# Patient Record
Sex: Female | Born: 1970
Health system: Southern US, Community
[De-identification: ages and names within clinical notes are randomized; demographics above are authoritative.]

## PROBLEM LIST (undated history)

## (undated) HISTORY — PX: TUBAL LIGATION: SHX77

## (undated) HISTORY — PX: CHOLECYSTECTOMY: SHX55

## (undated) HISTORY — PX: GASTRIC BYPASS: SHX52

---

## 2016-04-21 DIAGNOSIS — Z9884 Bariatric surgery status: Secondary | ICD-10-CM | POA: Diagnosis not present

## 2016-04-21 DIAGNOSIS — K6389 Other specified diseases of intestine: Secondary | ICD-10-CM | POA: Diagnosis not present

## 2016-04-21 DIAGNOSIS — E538 Deficiency of other specified B group vitamins: Secondary | ICD-10-CM | POA: Diagnosis not present

## 2016-06-19 DIAGNOSIS — Z1231 Encounter for screening mammogram for malignant neoplasm of breast: Secondary | ICD-10-CM | POA: Diagnosis not present

## 2017-02-01 DIAGNOSIS — H524 Presbyopia: Secondary | ICD-10-CM | POA: Diagnosis not present

## 2017-02-01 DIAGNOSIS — H5212 Myopia, left eye: Secondary | ICD-10-CM | POA: Diagnosis not present

## 2017-04-13 DIAGNOSIS — M6281 Muscle weakness (generalized): Secondary | ICD-10-CM | POA: Diagnosis not present

## 2017-04-13 DIAGNOSIS — I6789 Other cerebrovascular disease: Secondary | ICD-10-CM | POA: Diagnosis not present

## 2017-04-13 DIAGNOSIS — R51 Headache: Secondary | ICD-10-CM | POA: Diagnosis not present

## 2017-04-13 DIAGNOSIS — R202 Paresthesia of skin: Secondary | ICD-10-CM | POA: Diagnosis not present

## 2017-04-13 DIAGNOSIS — R42 Dizziness and giddiness: Secondary | ICD-10-CM | POA: Diagnosis not present

## 2017-04-13 DIAGNOSIS — R531 Weakness: Secondary | ICD-10-CM | POA: Diagnosis not present

## 2017-04-13 DIAGNOSIS — G4489 Other headache syndrome: Secondary | ICD-10-CM | POA: Diagnosis not present

## 2017-04-14 DIAGNOSIS — M6281 Muscle weakness (generalized): Secondary | ICD-10-CM | POA: Diagnosis not present

## 2017-04-15 ENCOUNTER — Encounter (HOSPITAL_COMMUNITY): Payer: Self-pay

## 2017-04-15 ENCOUNTER — Emergency Department (HOSPITAL_COMMUNITY): Payer: 59

## 2017-04-15 ENCOUNTER — Emergency Department (HOSPITAL_COMMUNITY)
Admission: EM | Admit: 2017-04-15 | Discharge: 2017-04-16 | Disposition: A | Discharge status: Home / Self Care | Payer: 59 | Attending: Emergency Medicine | Admitting: Emergency Medicine

## 2017-04-15 ENCOUNTER — Ambulatory Visit (INDEPENDENT_AMBULATORY_CARE_PROVIDER_SITE_OTHER): Payer: 59 | Admitting: Physician Assistant

## 2017-04-15 VITALS — BP 118/68 | HR 64 | Temp 98.3°F | Resp 17 | Ht 63.25 in | Wt 163.4 lb

## 2017-04-15 DIAGNOSIS — F446 Conversion disorder with sensory symptom or deficit: Secondary | ICD-10-CM | POA: Insufficient documentation

## 2017-04-15 DIAGNOSIS — R531 Weakness: Secondary | ICD-10-CM

## 2017-04-15 DIAGNOSIS — R29898 Other symptoms and signs involving the musculoskeletal system: Secondary | ICD-10-CM | POA: Diagnosis not present

## 2017-04-15 DIAGNOSIS — R404 Transient alteration of awareness: Secondary | ICD-10-CM | POA: Diagnosis not present

## 2017-04-15 DIAGNOSIS — R29810 Facial weakness: Secondary | ICD-10-CM | POA: Insufficient documentation

## 2017-04-15 DIAGNOSIS — R202 Paresthesia of skin: Secondary | ICD-10-CM | POA: Diagnosis not present

## 2017-04-15 LAB — I-STAT CHEM 8, ED
BUN: 14 mg/dL (ref 6–20)
CALCIUM ION: 1.05 mmol/L — AB (ref 1.15–1.40)
CHLORIDE: 104 mmol/L (ref 101–111)
Creatinine, Ser: 0.6 mg/dL (ref 0.44–1.00)
Glucose, Bld: 94 mg/dL (ref 65–99)
HCT: 31 % — ABNORMAL LOW (ref 36.0–46.0)
Hemoglobin: 10.5 g/dL — ABNORMAL LOW (ref 12.0–15.0)
POTASSIUM: 3.8 mmol/L (ref 3.5–5.1)
SODIUM: 139 mmol/L (ref 135–145)
TCO2: 24 mmol/L (ref 0–100)

## 2017-04-15 LAB — COMPREHENSIVE METABOLIC PANEL
ALT: 15 U/L (ref 14–54)
ANION GAP: 9 (ref 5–15)
AST: 18 U/L (ref 15–41)
Albumin: 3.8 g/dL (ref 3.5–5.0)
Alkaline Phosphatase: 55 U/L (ref 38–126)
BUN: 13 mg/dL (ref 6–20)
CALCIUM: 8.5 mg/dL — AB (ref 8.9–10.3)
CHLORIDE: 106 mmol/L (ref 101–111)
CO2: 23 mmol/L (ref 22–32)
Creatinine, Ser: 0.64 mg/dL (ref 0.44–1.00)
GFR calc non Af Amer: >60 mL/min (ref >60)
Glucose, Bld: 97 mg/dL (ref 65–99)
Potassium: 3.6 mmol/L (ref 3.5–5.1)
SODIUM: 138 mmol/L (ref 135–145)
Total Bilirubin: 0.6 mg/dL (ref 0.3–1.2)
Total Protein: 6.5 g/dL (ref 6.5–8.1)

## 2017-04-15 LAB — DIFFERENTIAL
BASOS PCT: 0 %
Basophils Absolute: 0 10*3/uL (ref 0.0–0.1)
EOS ABS: 0.1 10*3/uL (ref 0.0–0.7)
EOS PCT: 1 %
Lymphocytes Relative: 46 %
Lymphs Abs: 3.3 10*3/uL (ref 0.7–4.0)
MONO ABS: 0.4 10*3/uL (ref 0.1–1.0)
Monocytes Relative: 5 %
Neutro Abs: 3.5 10*3/uL (ref 1.7–7.7)
Neutrophils Relative %: 48 %

## 2017-04-15 LAB — POCT CBC
GRANULOCYTE PERCENT: 46 % (ref 37–80)
HCT, POC: 35.2 % — AB (ref 37.7–47.9)
Hemoglobin: 11.3 g/dL — AB (ref 12.2–16.2)
Lymph, poc: 3.8 — AB (ref 0.6–3.4)
MCH, POC: 23.9 pg — AB (ref 27–31.2)
MCHC: 32.2 g/dL (ref 31.8–35.4)
MCV: 74.3 fL — AB (ref 80–97)
MID (cbc): 0.5 (ref 0–0.9)
MPV: 7 fL (ref 0–99.8)
PLATELET COUNT, POC: 450 10*3/uL — AB (ref 142–424)
POC Granulocyte: 3.7 (ref 2–6.9)
POC LYMPH %: 47.3 % (ref 10–50)
POC MID %: 6.7 %M (ref 0–12)
RBC: 4.73 M/uL (ref 4.04–5.48)
RDW, POC: 15.7 %
WBC: 8 10*3/uL (ref 4.6–10.2)

## 2017-04-15 LAB — CBC
HCT: 31.6 % — ABNORMAL LOW (ref 36.0–46.0)
Hemoglobin: 10 g/dL — ABNORMAL LOW (ref 12.0–15.0)
MCH: 24 pg — AB (ref 26.0–34.0)
MCHC: 31.6 g/dL (ref 30.0–36.0)
MCV: 75.8 fL — AB (ref 78.0–100.0)
PLATELETS: 367 10*3/uL (ref 150–400)
RBC: 4.17 MIL/uL (ref 3.87–5.11)
RDW: 15.2 % (ref 11.5–15.5)
WBC: 7.3 10*3/uL (ref 4.0–10.5)

## 2017-04-15 LAB — I-STAT TROPONIN, ED: Troponin i, poc: 0 ng/mL (ref 0.00–0.08)

## 2017-04-15 LAB — PROTIME-INR
INR: 1.03
PROTHROMBIN TIME: 13.6 s (ref 11.4–15.2)

## 2017-04-15 LAB — APTT: aPTT: 27 seconds (ref 24–36)

## 2017-04-15 LAB — POCT GLYCOSYLATED HEMOGLOBIN (HGB A1C): HEMOGLOBIN A1C: 5.9

## 2017-04-15 MED ORDER — GADOBENATE DIMEGLUMINE 529 MG/ML IV SOLN
15.0000 mL | Freq: Once | INTRAVENOUS | Status: AC
  Administered 2017-04-15: 15 mL via INTRAVENOUS

## 2017-04-15 NOTE — ED Notes (Signed)
Pt has returned from MRI. 

## 2017-04-15 NOTE — ED Notes (Signed)
Pt returned from MRI °

## 2017-04-15 NOTE — Patient Instructions (Signed)
     IF you received an x-ray today, you will receive an invoice from [facility]. Please contact [facility] at [phone] with questions or concerns regarding your invoice.   IF you received labwork today, you will receive an invoice from [facility]. Please contact [facility] at [phone] with questions or concerns regarding your invoice.   Our billing staff will not be able to assist you with questions regarding bills from these companies.  You will be contacted with the lab results as soon as they are available. The fastest way to get your results is to activate your My Chart account. Instructions are located on the last page of this paperwork. If you have not heard from us regarding the results in 2 weeks, please contact this office.    We recommend that you schedule a mammogram for breast cancer screening. Typically, you do not need a referral to do this. Please contact a local imaging center to schedule your mammogram.  [facility] - [phone]  *ask for the Radiology Department [facility]) - [phone] or [phone]  [facility] - [phone] [facility] - [phone] [facility] - [phone]  *ask for the Radiology Department [facility] - [phone]  *ask for the Radiology Department [facility] - [phone]  *ask for the Mammography Department [facility] - [phone] 

## 2017-04-15 NOTE — ED Triage Notes (Signed)
PER EMS: pt from [facility]: Wednesday she was at work, had sudden onset of weakness. She was transferred to [facility] and had a negative workup. Today, while pt was at work she began to have weakness and tingling to left arm that started at 9am. CBG-100, O2-99% RA, BP- 110/59, HR-61 sinus rhythm, RR-16. Denies headache, pupils equal and reactive. No slurred speech, no droop. Pt is ambulatory independently. Pt states she is still experiencing the weakness and tingling to left arm.

## 2017-04-15 NOTE — Progress Notes (Signed)
04/15/2017 5:46 PM   DOB: 1971/06/18 / MRN: [id number]  SUBJECTIVE:  Carly Rogers is a 46 y.o. female presenting for ED follow up. Tells me that she was having stroke like symptoms and was taken to the ED at [facility] where a CT head was performed and she was told that she had "a blockage in the left side of her brain."  She continues have weakness and tingling in her left side today. She was advised to take an aspirin daily and to make an appointment with the neurologist that saw her.   She is now taking ASA and a multivitamin. She denies a history of HTN, diabetes, CAD, and she is a never smoker.  Her father did die of a heart attack at age 54.    She has no allergies on file.   She  has no past medical history on file.    She   She  has no sexual activity history on file. The patient  has no past surgical history on file.  Her family history is not on file.  Review of Systems  Constitutional: Negative for chills and fever.  Eyes: Negative for blurred vision and double vision.  Respiratory: Negative for shortness of breath.   Cardiovascular: Negative for chest pain.  Gastrointestinal: Negative for nausea.  Skin: Negative for rash.  Neurological: Positive for sensory change and headaches. Negative for tingling, tremors, speech change, focal weakness, seizures and loss of consciousness.    The problem list and medications were reviewed and updated by myself where necessary and exist elsewhere in the encounter.   OBJECTIVE:  BP 118/68 (BP Location: Right Arm, Patient Position: Sitting, Cuff Size: Normal)   Pulse 64   Temp 98.3 F (36.8 C) (Oral)   Resp 17   Ht 5' 3.25" (1.607 m)   Wt 163 lb 6.4 oz (74.1 kg)   LMP 02/11/2016 Comment: per patient thinks menopause is setting in.  SpO2 99%   BMI 28.72 kg/m   Physical Exam  Constitutional: She appears well-developed and well-nourished. She is active.  Non-toxic appearance. No distress.  She appears worried.  Cardiovascular:  Normal rate.   Pulmonary/Chest: Effort normal. No tachypnea.  Neurological: She is alert. Coordination and gait normal.  Positive pronator drift of left upper extremity.  Grip strength 4/5 on left compared to right.   Skin: Skin is warm and dry. She is not diaphoretic. No pallor.    No results found for this or any previous visit (from the past 72 hour(s)).  No results found.  CT code stroke on 74/18:  1. No acute intracranial abnormality. However, if there is clinical concern for acute ischemia, MRI could further evaluate as CT is relatively insensitive for the detection of an acute infarct in the first 24 to 48 hours.  2. Luminal irregularity along the right cervical ICA has an appearance favoring a connective tissue disorder such as fibromuscular dysplasia (FMD) over dissection given that the carotid has a normal appearance at the skull base. No dissection flap or intraluminal thrombus is identified. MRA with axial fat-saturated T1 precontrast sequence could further evaluate for the presence of a dissection if clinically indicated. There is questionable minimal luminal irregularity along the proximal left ICA; however, this process appears to predominantly involve the right internal carotid artery. 3. No aneurysm, significant intracranial stenosis, or major branch vessel occlusion. 4. No evidence of infarction or significant penumbra on the cerebral perfusion analysis.  ASSESSMENT AND PLAN:  [name] was seen  today for heat exposure and headaches.  Diagnoses and all orders for this visit:  Weakness of right arm: Patient seen at [facility] and stroke was ruled out at that time given a normal CT with some mild abnormalties:  (Luminal irregularity along the right cervical ICA has an appearance favoring a connective tissue disorder such as fibromuscular dysplasia (FMD) over dissection given that the carotid has a normal appearance at the skull base. No dissection flap or intraluminal thrombus is  identified. MRA with axial fat-saturated T1 precontrast sequence could further evaluate for the presence of a dissection if clinically indicated. There is questionable minimal luminal irregularity along the proximal left ICA; however, this process appears to predominantly involve the right internal carotid artery.)   Per care everywhere she has had a change in her neurological exam as she had no pronator drift and no grip strength changes.  She was also seen in the setting of dehydration/migraine and this could be clouding the a true diagnosis.    Per her CT scan at [facility] an MRA was recommended if her symptoms did not abate.  I am sending her to [facility] ED as I think she is either having or has had a CVA since being seen in the ED at [facility] and she would benefit from an MRA at this point and possible repeat neurological eval.  Appreciate the care of our EDP.    -     Lipid panel -     POCT CBC -     CMP14+EGFR -     POCT glycosylated hemoglobin (Hb A1C)    The patient is advised to call or return to clinic if she does not see an improvement in symptoms, or to seek the care of the closest emergency department if she worsens with the above plan.   Philis Fendt, MHS, PA-C [facility] 04/15/2017 5:46 PM

## 2017-04-15 NOTE — ED Notes (Signed)
Pt still off floor at MRI 

## 2017-04-15 NOTE — ED Notes (Signed)
Pt has been transported to MRI at this time.

## 2017-04-15 NOTE — ED Provider Notes (Signed)
[facility] DEPT Provider Note   CSN: [id number] Arrival date & time: 04/15/17  1836     History   Chief Complaint Chief Complaint  Patient presents with  . Weakness    HPI Carly Rogers is a 46 y.o. female.  HPI  46 year old female presents today with complaints of left-sided weakness.  Patient notes that on July 4 she was [place].  She felt hot and developed left-sided numbness tingling and weakness.  She was taken to [facility] where she had an evaluation including CT scan which showed no acute findings.  She was seen by neurology who recommended outpatient follow-up.  Patient notes that her symptoms are improving while she was at the hospital, was able to walk out of the emergency room.  She notes yesterday she took work off and still had very minor symptoms but had significantly improved.  Patient notes today she developed recurrence of symptoms with worsening left-sided numbness tingling and weakness.  She notes this is located on left side of her face, arm and lower extremity.  She notes this is diffuse.  She reports symptoms started after receiving a frustrating text from her employer.  She reports some fullness and pressure in the left side of her head, she denies any difficulty swallowing, trauma, infectious etiologies.   History reviewed. No pertinent past medical history.  There are no active problems to display for this patient.   Past Surgical History:  Procedure Laterality Date  . CHOLECYSTECTOMY    . GASTRIC BYPASS    . TUBAL LIGATION      OB History    No data available       Home Medications    Prior to Admission medications   Not on File    Family History No family history on file.  Social History Social History  Substance Use Topics  . Smoking status: Never Smoker  . Smokeless tobacco: Never Used  . Alcohol use Yes     Allergies   Patient has no allergy information on  record.   Review of Systems Review of Systems  All other systems reviewed and are negative.    Physical Exam Updated Vital Signs BP 128/63 (BP Location: Right Arm)   Pulse 61   Temp 98.8 F (37.1 C) (Oral)   Resp 18   LMP 02/10/2017   SpO2 100%   Physical Exam  Constitutional: She is oriented to person, place, and time. She appears well-developed and well-nourished.  HENT:  Head: Normocephalic and atraumatic.  Eyes: Conjunctivae are normal. Pupils are equal, round, and reactive to light. Right eye exhibits no discharge. Left eye exhibits no discharge. No scleral icterus.  Neck: Normal range of motion. No JVD present. No tracheal deviation present.  Cardiovascular: Normal rate, regular rhythm, normal heart sounds and intact distal pulses.  Exam reveals no gallop and no friction rub.   No murmur heard. Pulmonary/Chest: Effort normal. No stridor.  Neurological: She is alert and oriented to person, place, and time. A sensory deficit is present. No cranial nerve deficit. Coordination normal.  Decreased sensation to the left side of the face, left upper extremity, left lower extremity.  No facial asymmetry or weakness, minor weakness to the left upper and left lower extremity strength is still 5 out of 5.   Psychiatric: She has a normal mood and affect. Her behavior is normal. Judgment and thought content normal.  Nursing note and vitals reviewed.  ED Treatments /  Results  Labs (all labs ordered are listed, but only abnormal results are displayed) Labs Reviewed  I-STAT CHEM 8, ED - Abnormal; Notable for the following:       Result Value   Calcium, Ion 1.05 (*)    Hemoglobin 10.5 (*)    HCT 31.0 (*)    All other components within normal limits  PROTIME-INR  APTT  CBC  DIFFERENTIAL  COMPREHENSIVE METABOLIC PANEL  I-STAT TROPOININ, ED    EKG  EKG Interpretation None       Radiology No results found.  Procedures Procedures (including critical care  time)  Medications Ordered in ED Medications - No data to display   Initial Impression / Assessment and Plan / ED Course  I have reviewed the triage vital signs and the nursing notes.  Pertinent labs & imaging results that were available during my care of the patient were reviewed by me and considered in my medical decision making (see chart for details).    Final Clinical Impressions(s) / ED Diagnoses   Final diagnoses:  Left-sided weakness    Imaging: MRI without  Consults:  Therapeutics:  Discharge Meds:   Assessment/Plan: 47 year old female presents today with left-sided weakness.  Patient has had intermittent symptoms over the last several days.  She was worked up at [facility] with [place] scan.  She was seen by neurology who recommended no further evaluation and management, recommended outpatient evaluation.  Patient having recurrence of symptoms, have low suspicion for acute intracranial abnormality at this time.  Patient will have an MRI here with likely disposition home with no significant findings noted.  Patient care will be transferred to oncoming provider pending MRI and disposition.   New Prescriptions New Prescriptions   No medications on file     Francee Gentile 04/15/17 2008    Charlesetta Shanks, MD 04/16/17 [id number]

## 2017-04-15 NOTE — ED Notes (Signed)
Pt returned from MRI and connected back to cardiac monitor, pulse ox and BP cuff.

## 2017-04-15 NOTE — ED Notes (Signed)
Pt has been taken back to MRI

## 2017-04-16 LAB — CMP14+EGFR
ALBUMIN: 4.7 g/dL (ref 3.5–5.5)
ALT: 17 IU/L (ref 0–32)
AST: 18 IU/L (ref 0–40)
Albumin/Globulin Ratio: 1.6 (ref 1.2–2.2)
Alkaline Phosphatase: 67 IU/L (ref 39–117)
BILIRUBIN TOTAL: 0.5 mg/dL (ref 0.0–1.2)
BUN / CREAT RATIO: 23 (ref 9–23)
BUN: 15 mg/dL (ref 6–24)
CHLORIDE: 102 mmol/L (ref 96–106)
CO2: 22 mmol/L (ref 20–29)
CREATININE: 0.64 mg/dL (ref 0.57–1.00)
Calcium: 9.1 mg/dL (ref 8.7–10.2)
GFR calc non Af Amer: 107 mL/min/{1.73_m2} (ref >59)
GFR, EST AFRICAN AMERICAN: 123 mL/min/{1.73_m2} (ref >59)
GLOBULIN, TOTAL: 2.9 g/dL (ref 1.5–4.5)
GLUCOSE: 92 mg/dL (ref 65–99)
Potassium: 4.2 mmol/L (ref 3.5–5.2)
SODIUM: 141 mmol/L (ref 134–144)
TOTAL PROTEIN: 7.6 g/dL (ref 6.0–8.5)

## 2017-04-16 LAB — LIPID PANEL
CHOLESTEROL TOTAL: 224 mg/dL — AB (ref 100–199)
Chol/HDL Ratio: 2.7 ratio (ref 0.0–4.4)
HDL: 82 mg/dL (ref >39)
LDL Calculated: 119 mg/dL — ABNORMAL HIGH (ref 0–99)
Triglycerides: 115 mg/dL (ref 0–149)
VLDL Cholesterol Cal: 23 mg/dL (ref 5–40)

## 2017-04-16 NOTE — ED Provider Notes (Signed)
MRI has some abnormalities that will need to reevaluated in 6 months with repeat MRI Patient will be referred to Neurology for furhter evaluation and continued monitoring    Junius Creamer, NP 04/16/17 5436    Charlesetta Shanks, MD 04/16/17 1347

## 2017-04-16 NOTE — ED Notes (Signed)
Pt ambulated to restroom and back.

## 2017-04-16 NOTE — Discharge Instructions (Signed)
As discussed there is a subtile abnormality on your MRI that will need to be repeated in 6 months In the mean time make an appointment with neurology for further evaluation of your weakness Keep a written diary of your symptoms to include when it happen how long it lasts, what your are doing at the time and what you do to alleviate the symptoms

## 2017-04-19 ENCOUNTER — Other Ambulatory Visit: Payer: 59

## 2017-04-19 ENCOUNTER — Encounter: Payer: Self-pay | Admitting: Neurology

## 2017-04-19 ENCOUNTER — Ambulatory Visit (INDEPENDENT_AMBULATORY_CARE_PROVIDER_SITE_OTHER): Payer: 59 | Admitting: Neurology

## 2017-04-19 VITALS — BP 124/80 | HR 73 | Ht 63.25 in | Wt 163.0 lb

## 2017-04-19 DIAGNOSIS — R200 Anesthesia of skin: Secondary | ICD-10-CM | POA: Diagnosis not present

## 2017-04-19 DIAGNOSIS — R9082 White matter disease, unspecified: Secondary | ICD-10-CM

## 2017-04-19 DIAGNOSIS — G43109 Migraine with aura, not intractable, without status migrainosus: Secondary | ICD-10-CM

## 2017-04-19 DIAGNOSIS — R930 Abnormal findings on diagnostic imaging of skull and head, not elsewhere classified: Secondary | ICD-10-CM

## 2017-04-19 MED ORDER — GABAPENTIN 100 MG PO CAPS: 100.0000 mg | ORAL_CAPSULE | Freq: Two times a day (BID) | ORAL | 3 refills | Status: DC

## 2017-04-19 NOTE — Patient Instructions (Signed)
1.  To help with the left sided "heaviness" feeling, we will start gabapentin 100mg  twice daily.  You may increase dose to 2 capsules twice daily in 2 weeks if needed. 2.  We will check MRI of cervical spine with and without contrast. Further recommendations depending on results.  If unremarkable, I would repeat MRI of brain with and without contrast in 6 months. 3. Check Bhavana, Sed rate, SSA/SSB antibodies (Sjogren's) and ANCA  4. Follow up in 6 months

## 2017-04-19 NOTE — Progress Notes (Signed)
NEUROLOGY CONSULTATION NOTE  Carly Rogers MRN: [id number] DOB: 1970/12/07  Referring provider: Charlesetta Shanks, MD Primary care provider: no PCP   Reason for consult:  Left sided numbness and weakness  HISTORY OF PRESENT ILLNESS: Carly Rogers is a 46 year old right-handed female who presents for left sided weakness.  History supplemented by ED note.  On 04/13/17, she was working outside at the baseball [place] in the heat when she suddenly felt dizzy, like she was going to pass out.  She was brought inside to a cool room for heat stroke.  While in the room, she developed numbness and tingling of the left side of her body (face, arm and leg) and weakness.  She had associated left sided moderate dull nonthrobbing headache.  There was no associated nausea or visual disturbance.  She was taken to [facility], where CT of head was performed and revealed no acute findings.  CTA of head revealed no intracranial stenosis or occlusion.  CTA of neck revealed luminal irregularity in the right ICA at the C1-2 level, suspicious for possible fibromuscular dysplasia.  She was discharged with instructions to follow up with outpatient neurology.  Symptoms improved while in the hospital.  On 04/15/17, she became stressed about needing a letter to return to work when she developed a recurrence of the left sided numbness along with headache.  She presented the [facility] ED for further evaluation.  MRI of brain with and without contrast was personally reviewed and revealed scattered punctate non-enhancing T2 hyperintense foci in the periventricular white matter.  Symptoms lasted all day.  While headache and most of the numbness have resolved, she still feels a heaviness involving the left side of her head, neck and arm, along with a dull numbness.  She reports mild left sided neck pain.  She denies personal history of headache.  She denies family history of headache.  She does report recent  stressors.  04/15/17 LABS:  CBC with WBC 7.3, HGB 10, HCT 31.6, MCV 75.8, PLT 367; CMP with Na 138, K 3.6, Cl 106, CO2 23, glucose 97, BUN 13, Cr 0.64, total bili 0.6, ALP 55, AST 18 and ALT 15.  PAST MEDICAL HISTORY: No past medical history on file.  PAST SURGICAL HISTORY: Past Surgical History:  Procedure Laterality Date  . CHOLECYSTECTOMY    . GASTRIC BYPASS    . TUBAL LIGATION      MEDICATIONS: Current Outpatient Prescriptions on File Prior to Visit  Medication Sig Dispense Refill  . aspirin EC 81 MG tablet Take 81 mg by mouth daily.    Marland Kitchen esomeprazole (NEXIUM) 20 MG capsule Take 20 mg by mouth daily as needed (heart burn).    . Multiple Vitamin (MULTIVITAMIN WITH MINERALS) TABS tablet Take 1 tablet by mouth daily.     No current facility-administered medications on file prior to visit.     ALLERGIES: No Known Allergies  FAMILY HISTORY: Family History  Problem Relation Age of Onset  . Hypertension Mother   . Heart attack Father   . Stroke Maternal Grandmother     SOCIAL HISTORY: Social History   Social History  . Marital status: Married    Spouse name: N/A  . Number of children: 4  . Years of education: N/A   Occupational History  . [employer]   Social History Main Topics  . Smoking status: Never Smoker  . Smokeless tobacco: Never Used  . Alcohol use Yes  Comment: occasinally...1 glass of wine a month  . Drug use: Unknown  . Sexual activity: Not on file   Other Topics Concern  . Not on file   Social History Narrative   Lives in [place] with husband and child. Has 4 children and one grandson.     REVIEW OF SYSTEMS: Constitutional: No fevers, chills, or sweats, no generalized fatigue, change in appetite Eyes: No visual changes, double vision, eye pain Ear, nose and throat: No hearing loss, ear pain, nasal congestion, sore throat Cardiovascular: No chest pain, palpitations Respiratory:  No shortness of breath at  rest or with exertion, wheezes GastrointestinaI: No nausea, vomiting, diarrhea, abdominal pain, fecal incontinence Genitourinary:  No dysuria, urinary retention or frequency Musculoskeletal:  Mild left sided neck pain Integumentary: No rash, pruritus, skin lesions Neurological: as above Psychiatric: No depression, insomnia, anxiety Endocrine: No palpitations, fatigue, diaphoresis, mood swings, change in appetite, change in weight, increased thirst Hematologic/Lymphatic:  No purpura, petechiae. Allergic/Immunologic: no itchy/runny eyes, nasal congestion, recent allergic reactions, rashes  PHYSICAL EXAM: Vitals:   04/19/17 0948  BP: 124/80  Pulse: 73   General: No acute distress.  Patient appears well-groomed.  Head:  Normocephalic/atraumatic Eyes:  fundi examined but not visualized Neck: supple, mild left suboccipital and left sided tenderness, full range of motion Back: No paraspinal tenderness Heart: regular rate and rhythm Lungs: Clear to auscultation bilaterally. Vascular: No carotid bruits. Neurological Exam: Mental status: alert and oriented to person, place, and time, recent and remote memory intact, fund of knowledge intact, attention and concentration intact, speech fluent and not dysarthric, language intact. Cranial nerves: CN I: not tested CN II: pupils equal, round and reactive to light, visual fields intact CN III, IV, VI:  full range of motion, no nystagmus, no ptosis CN V: facial sensation slightly reduced on left V1-V3 CN VII: upper and lower face symmetric CN VIII: hearing intact CN IX, X: gag intact, uvula midline CN XI: sternocleidomastoid and trapezius muscles intact CN XII: tongue midline Bulk & Tone: normal, no fasciculations. Motor:  Mild giveaway weakness in left triceps and 5-/5 left hand grip.  Otherwise, 5/5 throughout  Sensation:  Pinprick sensation reduced in left upper extremity and vibration sensation intact. Deep Tendon Reflexes:  2+ throughout,  toes downgoing. Finger to nose testing:  Without dysmetria.  Heel to shin:  Without dysmetria.  Gait:  Normal station and stride.  Able to turn and tandem walk. Romberg negative.  IMPRESSION: 1.  Probable complicated migraine with improved but still persistent aura.  MRI revealed no acute abnormality.  CTA of head and neck show no significant stenosis or occlusion to account for symptoms. 2.  Abnormal white matter abnormalities on MRI of brain.  Nonspecific findings and not specifically characteristic of a demyelinating disease.  However, she does not have stroke risk factors and denies history of migraine.  PLAN: 1.  We will check MRI of cervical spine with and without contrast to evaluate for any demyelinating spinal cord lesion.  If unremarkable, I would repeat MRI of brain with and without contrast in 6 months. 2.  Will check Laterra, Sed Rate, SSA/SSB antibodies and ANCA 3.  To address the "heavy" sensation of her head and arm, will start gabapentin 100mg  twice daily and we can titrate up from there. 4.  Follow up  Thank you for allowing me to take part in the care of this patient.  Metta Clines, DO

## 2017-04-20 LAB — SEDIMENTATION RATE: SED RATE: 27 mm/h — AB (ref 0–20)

## 2017-04-20 LAB — ANA: ANA: NEGATIVE

## 2017-04-20 LAB — SJOGRENS SYNDROME-A EXTRACTABLE NUCLEAR ANTIBODY: SSA (RO) (ENA) ANTIBODY, IGG: NEGATIVE

## 2017-04-21 DIAGNOSIS — K6389 Other specified diseases of intestine: Secondary | ICD-10-CM | POA: Diagnosis not present

## 2017-04-21 DIAGNOSIS — E663 Overweight: Secondary | ICD-10-CM | POA: Diagnosis not present

## 2017-04-21 DIAGNOSIS — Z6829 Body mass index (BMI) 29.0-29.9, adult: Secondary | ICD-10-CM | POA: Diagnosis not present

## 2017-04-21 DIAGNOSIS — Z9884 Bariatric surgery status: Secondary | ICD-10-CM | POA: Diagnosis not present

## 2017-04-21 LAB — ANCA SCREEN W REFLEX TITER: ANCA SCREEN: NEGATIVE

## 2017-04-22 ENCOUNTER — Telehealth: Payer: Self-pay | Admitting: *Deleted

## 2017-04-22 ENCOUNTER — Encounter: Payer: Self-pay | Admitting: *Deleted

## 2017-04-22 NOTE — Telephone Encounter (Signed)
Patient notified of normal results via My Chart.

## 2017-04-22 NOTE — Telephone Encounter (Signed)
See next note

## 2017-04-22 NOTE — Telephone Encounter (Signed)
-----   Message from Pieter Partridge, DO sent at 04/22/2017  7:19 AM EDT -----   ----- Message ----- From: Pieter Partridge, DO Sent: 04/21/2017   5:53 PM To: Aileen Pilot Alondra Sahni, LPN  Labs all look okay

## 2017-04-29 ENCOUNTER — Ambulatory Visit
Admission: RE | Admit: 2017-04-29 | Discharge: 2017-04-29 | Disposition: A | Discharge status: Home / Self Care | Payer: 59 | Source: Ambulatory Visit | Attending: Neurology | Admitting: Neurology

## 2017-04-29 DIAGNOSIS — R200 Anesthesia of skin: Secondary | ICD-10-CM

## 2017-04-29 DIAGNOSIS — M47812 Spondylosis without myelopathy or radiculopathy, cervical region: Secondary | ICD-10-CM | POA: Diagnosis not present

## 2017-04-29 DIAGNOSIS — R9082 White matter disease, unspecified: Secondary | ICD-10-CM

## 2017-04-29 MED ORDER — GADOBENATE DIMEGLUMINE 529 MG/ML IV SOLN: 15.0000 mL | Freq: Once | INTRAVENOUS | Status: DC | PRN

## 2017-05-09 ENCOUNTER — Telehealth: Payer: Self-pay | Admitting: Neurology

## 2017-05-09 DIAGNOSIS — R200 Anesthesia of skin: Secondary | ICD-10-CM

## 2017-05-09 NOTE — Telephone Encounter (Signed)
Patient made aware. Reminder sent to [facility] to order MR Brain in 6 months.

## 2017-05-09 NOTE — Telephone Encounter (Signed)
-----   Message from Pieter Partridge, DO sent at 05/09/2017 10:40 AM EDT ----- MRI of cervical spine looks okay.  Repeat MRI of brain with and without contrast in 6 months (prior to follow up).

## 2017-10-12 ENCOUNTER — Telehealth: Payer: Self-pay

## 2017-10-12 NOTE — Addendum Note (Signed)
Addended by: Clois Comber on: 10/12/2017 02:43 PM   Modules accepted: Orders

## 2017-10-12 NOTE — Telephone Encounter (Signed)
Called scheduling at [facility], spoke with Melvenia Beam, they will contact Pt to schedule MRI brain w w/o

## 2017-10-21 ENCOUNTER — Ambulatory Visit: Payer: 59 | Admitting: Neurology

## 2017-11-17 ENCOUNTER — Ambulatory Visit
Admission: RE | Admit: 2017-11-17 | Discharge: 2017-11-17 | Disposition: A | Discharge status: Home / Self Care | Payer: 59 | Source: Ambulatory Visit | Attending: Neurology | Admitting: Neurology

## 2017-11-17 DIAGNOSIS — R200 Anesthesia of skin: Secondary | ICD-10-CM

## 2017-11-17 DIAGNOSIS — H538 Other visual disturbances: Secondary | ICD-10-CM | POA: Diagnosis not present

## 2017-11-17 MED ORDER — GADOBENATE DIMEGLUMINE 529 MG/ML IV SOLN
15.0000 mL | Freq: Once | INTRAVENOUS | Status: AC | PRN
  Administered 2017-11-17: 15 mL via INTRAVENOUS

## 2017-11-21 ENCOUNTER — Telehealth: Payer: Self-pay

## 2017-11-21 NOTE — Telephone Encounter (Signed)
-----   Message from Pieter Partridge, DO sent at 11/18/2017  6:44 AM EST ----- MRI of brain is unchanged.  It shows the small spots but it has not progressed.  At this point, I don't think there is any clinical significance (sometimes we see people with tiny spots on the brain for unknown reason).

## 2017-11-21 NOTE — Telephone Encounter (Signed)
Called and LM on VM for Pt to call me back about MRI

## 2017-11-22 ENCOUNTER — Telehealth: Payer: Self-pay | Admitting: Neurology

## 2017-11-22 NOTE — Telephone Encounter (Signed)
Called and spoke with Pt, advsd of MRI results. Pt questioned if the spots could be causing memory problems. Advsd her at this point, they pose no clinical significance.

## 2017-11-22 NOTE — Telephone Encounter (Signed)
Pt left a message saying she was returning a call from [place]

## 2018-02-22 ENCOUNTER — Encounter: Payer: Self-pay | Admitting: Neurology

## 2018-02-22 ENCOUNTER — Ambulatory Visit (INDEPENDENT_AMBULATORY_CARE_PROVIDER_SITE_OTHER): Payer: 59 | Admitting: Neurology

## 2018-02-22 VITALS — BP 116/70 | HR 71 | Ht 63.0 in | Wt 165.0 lb

## 2018-02-22 DIAGNOSIS — R9082 White matter disease, unspecified: Secondary | ICD-10-CM

## 2018-02-22 DIAGNOSIS — G43109 Migraine with aura, not intractable, without status migrainosus: Secondary | ICD-10-CM | POA: Diagnosis not present

## 2018-02-22 NOTE — Progress Notes (Signed)
NEUROLOGY FOLLOW UP OFFICE NOTE  JOURDEN Rogers [id number]  HISTORY OF PRESENT ILLNESS: Carly Rogers is a 47 year old right-handed female who follows up for complicated migraine with persistent aura.  UPDATE: Labs from 04/19/17 include negative Amela, Sed Rate 27, negative Sjogrens antibodies and ANCA. To further evaluate left sided numbness, MRI of cervical spine with and without contrast was performed on 04/29/17 which was personally reviewed and revealed mild cervical spondylosis at C5-6 but no abnormal cord signal or evidence of central or foraminal stenosis.  Follow up MRI of brain with and without contrast from 11/17/17 was personally reviewed and was unchanged.   To address the "heavy" sensation of her head and arm, she was started on gabapentin 100mg  twice daily.  Symptoms had resolved and she has since discontinued it.  Since last summer, she reports a couple of brief episodes of left sided numbness and tingling lasting about half a day.  No associated weakness or headache.  They would occur in setting of increased stress.  HISTORY:  On 04/13/17, she [place] the [place] in [place] when she suddenly felt dizzy, like she was going to pass out.  She was brought inside to a cool room for heat stroke.  While in the room, she developed numbness and tingling of the left side of her body (face, arm and leg) and weakness.  She had associated left sided moderate dull nonthrobbing headache.  There was no associated nausea or visual disturbance.  She was taken to [facility], where CT of head was performed and revealed no acute findings.  CTA of head revealed no intracranial stenosis or occlusion.  CTA of neck revealed luminal irregularity in the right ICA at the C1-2 level, suspicious for possible fibromuscular dysplasia.  She was discharged with instructions to follow up with outpatient neurology.  Symptoms improved while in the hospital.  On 04/15/17, she became stressed  about needing a letter to return to work when she developed a recurrence of the left sided numbness along with headache.  She presented the [facility] ED for further evaluation.  MRI of brain with and without contrast scattered punctate non-enhancing T2 hyperintense foci in the periventricular white matter.  Symptoms lasted all day.  While headache and most of the numbness have resolved, she still feels a heaviness involving the left side of her head, neck and arm, along with a dull numbness.  She reports mild left sided neck pain.   She denies personal history of headache.  She denies family history of headache.  She does report recent stressors.  PAST MEDICAL HISTORY: History reviewed. No pertinent past medical history.  MEDICATIONS: Current Outpatient Medications on File Prior to Visit  Medication Sig Dispense Refill  . esomeprazole (NEXIUM) 20 MG capsule Take 20 mg by mouth daily as needed (heart burn).    . gabapentin (NEURONTIN) 100 MG capsule Take 1 capsule (100 mg total) by mouth 2 (two) times daily. 60 capsule 3  . Multiple Vitamin (MULTIVITAMIN WITH MINERALS) TABS tablet Take 1 tablet by mouth daily.     No current facility-administered medications on file prior to visit.     ALLERGIES: No Known Allergies  FAMILY HISTORY: Family History  Problem Relation Age of Onset  . Hypertension Mother   . Heart attack Father   . Stroke Maternal Grandmother     SOCIAL HISTORY: Social History   Socioeconomic History  . Marital status: Married    Spouse name:  Not on file  . Number of children: 4  . Years of education: Not on file  . Highest education level: Not on file  Occupational History  . Occupation: Psychologist, clinical: [employer]  . Financial resource strain: Not on file  . Food insecurity:    Worry: Not on file    Inability: Not on file  . Transportation needs:    Medical: Not on file    Non-medical: Not on file  Tobacco Use  .  Smoking status: Never Smoker  . Smokeless tobacco: Never Used  Substance and Sexual Activity  . Alcohol use: Yes    Comment: occasinally...1 glass of wine a month  . Drug use: Not on file  . Sexual activity: Not on file  Lifestyle  . Physical activity:    Days per week: Not on file    Minutes per session: Not on file  . Stress: Not on file  Relationships  . Social connections:    Talks on phone: Not on file    Gets together: Not on file    Attends religious service: Not on file    Active member of club or organization: Not on file    Attends meetings of clubs or organizations: Not on file    Relationship status: Not on file  . Intimate partner violence:    Fear of current or ex partner: Not on file    Emotionally abused: Not on file    Physically abused: Not on file    Forced sexual activity: Not on file  Other Topics Concern  . Not on file  Social History Narrative   Lives in [place] with husband and child. Has 4 children and one grandson.     REVIEW OF SYSTEMS: Constitutional: No fevers, chills, or sweats, no generalized fatigue, change in appetite Eyes: No visual changes, double vision, eye pain Ear, nose and throat: No hearing loss, ear pain, nasal congestion, sore throat Cardiovascular: No chest pain, palpitations Respiratory:  No shortness of breath at rest or with exertion, wheezes GastrointestinaI: No nausea, vomiting, diarrhea, abdominal pain, fecal incontinence Genitourinary:  No dysuria, urinary retention or frequency Musculoskeletal:  No neck pain, back pain Integumentary: No rash, pruritus, skin lesions Neurological: as above Psychiatric: No depression, insomnia, anxiety Endocrine: No palpitations, fatigue, diaphoresis, mood swings, change in appetite, change in weight, increased thirst Hematologic/Lymphatic:  No purpura, petechiae. Allergic/Immunologic: no itchy/runny eyes, nasal congestion, recent allergic reactions, rashes  PHYSICAL EXAM: Vitals:     02/22/18 1045  BP: 116/70  Pulse: 71  SpO2: 99%   General: No acute distress.  Patient appears well-groomed.  normal body habitus. Head:  Normocephalic/atraumatic Eyes:  Fundi examined but not visualized Neck: supple, no paraspinal tenderness, full range of motion Heart:  Regular rate and rhythm Lungs:  Clear to auscultation bilaterally Back: No paraspinal tenderness Neurological Exam: alert and oriented to person, place, and time. Attention span and concentration intact, recent and remote memory intact, fund of knowledge intact.  Speech fluent and not dysarthric, language intact.  CN II-XII intact. Bulk and tone normal, muscle strength 5/5 throughout.  Sensation to light touch  intact.  Deep tendon reflexes 2+ throughout.  Finger to nose testing intact.  Gait normal, Romberg negative.  IMPRESSION: 1.  Probable complicated migraine.  Recurrent episodes may be migraine or stress-related.  I do not suspect TIA. 2.  Abnormal white matter abnormalities on MRI of brain.  Nonspecific findings and not specifically  characteristic of a demyelinating disease.  Stable on repeat imaging.  PLAN: 1.  As I do not suspect a cerebrovascular event and she does not have any stroke risk factors, she may discontinue aspirin 2.  We will repeat MRI of brain with and without contrast in one year to follow up on any changes. 3.  Follow up in one year after repeat MRI.  15 minutes spent face to face with patient, over 50% spent discussing differential diagnosis and management.  Metta Clines, DO

## 2018-02-22 NOTE — Patient Instructions (Addendum)
I still think the numbness was either a migraine or stress related.  I DO NOT think you had a mini stroke.  Therefore you may stop the aspirin.  We will repeat MRI of brain with and without contrast in one year to follow up on any changes and follow up with me afterward

## 2018-02-28 DIAGNOSIS — Z124 Encounter for screening for malignant neoplasm of cervix: Secondary | ICD-10-CM | POA: Diagnosis not present

## 2018-02-28 DIAGNOSIS — Z01419 Encounter for gynecological examination (general) (routine) without abnormal findings: Secondary | ICD-10-CM | POA: Diagnosis not present

## 2018-02-28 DIAGNOSIS — R5383 Other fatigue: Secondary | ICD-10-CM | POA: Diagnosis not present

## 2018-02-28 DIAGNOSIS — Z1231 Encounter for screening mammogram for malignant neoplasm of breast: Secondary | ICD-10-CM | POA: Diagnosis not present

## 2018-03-08 DIAGNOSIS — M25511 Pain in right shoulder: Secondary | ICD-10-CM | POA: Diagnosis not present

## 2018-03-08 DIAGNOSIS — M7581 Other shoulder lesions, right shoulder: Secondary | ICD-10-CM | POA: Diagnosis not present

## 2018-03-08 DIAGNOSIS — M259 Joint disorder, unspecified: Secondary | ICD-10-CM | POA: Diagnosis not present

## 2018-03-08 DIAGNOSIS — M7541 Impingement syndrome of right shoulder: Secondary | ICD-10-CM | POA: Diagnosis not present

## 2018-03-08 DIAGNOSIS — M25512 Pain in left shoulder: Secondary | ICD-10-CM | POA: Diagnosis not present

## 2018-03-08 DIAGNOSIS — M7542 Impingement syndrome of left shoulder: Secondary | ICD-10-CM | POA: Diagnosis not present

## 2018-04-27 DIAGNOSIS — E669 Obesity, unspecified: Secondary | ICD-10-CM | POA: Diagnosis not present

## 2018-04-27 DIAGNOSIS — K6389 Other specified diseases of intestine: Secondary | ICD-10-CM | POA: Diagnosis not present

## 2018-04-27 DIAGNOSIS — D508 Other iron deficiency anemias: Secondary | ICD-10-CM | POA: Diagnosis not present

## 2018-05-01 MED FILL — CELECOXIB 200 MG CAP: 200 | 30 days supply | Qty: 60 | Fill #0

## 2018-08-02 MED FILL — CELECOXIB 200 MG CAP: 200 | 30 days supply | Qty: 60 | Fill #1

## 2018-10-25 ENCOUNTER — Encounter: Payer: Self-pay | Admitting: Neurology

## 2018-11-07 DIAGNOSIS — M222X1 Patellofemoral disorders, right knee: Secondary | ICD-10-CM | POA: Diagnosis not present

## 2018-11-10 DIAGNOSIS — M25561 Pain in right knee: Secondary | ICD-10-CM | POA: Diagnosis not present

## 2018-11-10 DIAGNOSIS — M2391 Unspecified internal derangement of right knee: Secondary | ICD-10-CM | POA: Diagnosis not present

## 2018-11-13 ENCOUNTER — Encounter: Payer: Self-pay | Admitting: Neurology

## 2018-11-15 ENCOUNTER — Other Ambulatory Visit: Payer: Self-pay | Admitting: Orthopedic Surgery

## 2018-11-15 DIAGNOSIS — M2391 Unspecified internal derangement of right knee: Secondary | ICD-10-CM

## 2018-11-15 DIAGNOSIS — M25561 Pain in right knee: Secondary | ICD-10-CM

## 2018-11-18 ENCOUNTER — Ambulatory Visit
Admission: RE | Admit: 2018-11-18 | Discharge: 2018-11-18 | Disposition: A | Discharge status: Home / Self Care | Payer: 59 | Source: Ambulatory Visit | Attending: Orthopedic Surgery | Admitting: Orthopedic Surgery

## 2018-11-18 DIAGNOSIS — M25561 Pain in right knee: Secondary | ICD-10-CM

## 2018-11-18 DIAGNOSIS — M2391 Unspecified internal derangement of right knee: Secondary | ICD-10-CM

## 2018-11-18 DIAGNOSIS — M75121 Complete rotator cuff tear or rupture of right shoulder, not specified as traumatic: Secondary | ICD-10-CM | POA: Diagnosis not present

## 2018-11-28 DIAGNOSIS — M958 Other specified acquired deformities of musculoskeletal system: Secondary | ICD-10-CM | POA: Diagnosis not present

## 2018-11-28 DIAGNOSIS — S83241A Other tear of medial meniscus, current injury, right knee, initial encounter: Secondary | ICD-10-CM | POA: Diagnosis not present

## 2018-11-30 DIAGNOSIS — S83241A Other tear of medial meniscus, current injury, right knee, initial encounter: Secondary | ICD-10-CM | POA: Diagnosis not present

## 2018-11-30 DIAGNOSIS — M958 Other specified acquired deformities of musculoskeletal system: Secondary | ICD-10-CM | POA: Diagnosis not present

## 2018-11-30 DIAGNOSIS — M21951 Unspecified acquired deformity of right thigh: Secondary | ICD-10-CM | POA: Diagnosis not present

## 2018-11-30 DIAGNOSIS — Z6830 Body mass index (BMI) 30.0-30.9, adult: Secondary | ICD-10-CM | POA: Diagnosis not present

## 2018-11-30 DIAGNOSIS — E785 Hyperlipidemia, unspecified: Secondary | ICD-10-CM | POA: Diagnosis not present

## 2018-11-30 DIAGNOSIS — Z79899 Other long term (current) drug therapy: Secondary | ICD-10-CM | POA: Diagnosis not present

## 2018-11-30 DIAGNOSIS — M2241 Chondromalacia patellae, right knee: Secondary | ICD-10-CM | POA: Diagnosis not present

## 2018-11-30 DIAGNOSIS — E669 Obesity, unspecified: Secondary | ICD-10-CM | POA: Diagnosis not present

## 2018-12-21 DIAGNOSIS — M2550 Pain in unspecified joint: Secondary | ICD-10-CM | POA: Diagnosis not present

## 2018-12-21 DIAGNOSIS — M159 Polyosteoarthritis, unspecified: Secondary | ICD-10-CM | POA: Diagnosis not present

## 2018-12-21 DIAGNOSIS — M1711 Unilateral primary osteoarthritis, right knee: Secondary | ICD-10-CM | POA: Diagnosis not present

## 2018-12-21 DIAGNOSIS — G8929 Other chronic pain: Secondary | ICD-10-CM | POA: Diagnosis not present

## 2018-12-21 DIAGNOSIS — M79641 Pain in right hand: Secondary | ICD-10-CM | POA: Diagnosis not present

## 2018-12-21 DIAGNOSIS — M79642 Pain in left hand: Secondary | ICD-10-CM | POA: Diagnosis not present

## 2019-01-31 DIAGNOSIS — Z4789 Encounter for other orthopedic aftercare: Secondary | ICD-10-CM | POA: Diagnosis not present

## 2019-02-06 ENCOUNTER — Other Ambulatory Visit: Payer: Self-pay

## 2019-02-06 ENCOUNTER — Encounter: Payer: Self-pay | Admitting: Physical Therapy

## 2019-02-06 ENCOUNTER — Ambulatory Visit: Payer: 59 | Attending: Orthopedic Surgery | Admitting: Physical Therapy

## 2019-02-06 DIAGNOSIS — R262 Difficulty in walking, not elsewhere classified: Secondary | ICD-10-CM | POA: Diagnosis not present

## 2019-02-06 DIAGNOSIS — M25661 Stiffness of right knee, not elsewhere classified: Secondary | ICD-10-CM | POA: Diagnosis not present

## 2019-02-06 DIAGNOSIS — M25561 Pain in right knee: Secondary | ICD-10-CM

## 2019-02-06 NOTE — Patient Instructions (Signed)
Access Code: [email or web address]  URL: [email or web address]  Date: 02/06/2019  Prepared by: Lum Babe   Exercises  Supine Short Arc Quad - 10 reps - 2 sets - 3 hold - 2x daily - 7x weekly  Short Arc Quad with [employer] - 10 reps - 2 sets - 3 hold - 2x daily - 7x weekly  Supine Bridge - 10 reps - 2 sets - 3 hold - 2x daily - 7x weekly  Standing Alternating Knee Flexion - 10 reps - 2 sets - 3 hold - 2x daily - 7x weekly

## 2019-02-06 NOTE — Therapy (Signed)
[place], [place], [address] Phone: [phone]   Fax:  [phone]  Physical Therapy Evaluation  Patient Details  Name: Carly Rogers MRN: [id number] Date of Birth: [birth year] Referring Provider (PT): Andee Poles   Encounter Date: 02/06/2019  PT End of Session - 02/06/19 1359    Visit Number  1    Date for PT Re-Evaluation  04/08/19    PT Start Time  0102    PT Stop Time  1413    PT Time Calculation (min)  40 min    Activity Tolerance  Patient tolerated treatment well    Behavior During Therapy  [facility] for tasks assessed/performed       History reviewed. No pertinent past medical history.  Past Surgical History:  Procedure Laterality Date  . CHOLECYSTECTOMY    . GASTRIC BYPASS    . TUBAL LIGATION      There were no vitals filed for this visit.   Subjective Assessment - 02/06/19 1337    Subjective  Patient reports that on 11/30/18 she underwent a right knee scope, she report sthat they did a meniscus repair.  Patient reports that she was 6 weeks non weightbearing.  She was then put in a don joy brace.  I was able to find the operative report and it looks like a chondroplasty was done as well.      Limitations  Walking;Lifting;House hold activities;Standing    How long can you walk comfortably?  difficulty > 4 minutes    Patient Stated Goals  have no pain, walk normal and have normal ROM and strength    Currently in Pain?  Yes    Pain Score  1     Pain Location  Knee    Pain Orientation  Right;Anterior;Medial    Pain Descriptors / Indicators  Aching    Pain Type  Acute pain;Surgical pain    Pain Onset  More than a month ago    Pain Frequency  Constant    Aggravating Factors   walking, weight bear on the right leg pain up to 8/10    Pain Relieving Factors  rest, pain meds pain can be a 1/10    Effect of Pain on Daily Activities  difficulty with ADL's, walking         [facility] PT Assessment -  02/06/19 0001      Assessment   Medical Diagnosis  S/P right knee scope with chondroplasty    Referring Provider (PT)  Azar    Onset Date/Surgical Date  11/30/18    Prior Therapy  no      Precautions   Precautions  None      Balance Screen   Has the patient fallen in the past 6 months  No    Has the patient had a decrease in activity level because of a fear of falling?   No    Is the patient reluctant to leave their home because of a fear of falling?   No      Home Environment   Additional Comments  has stairs, does the housework      Prior Function   Level of Independence  Independent    Vocation  Full time employment    Vocation Requirements  mostly sitting    Leisure  no exercise      ROM / Strength   AROM / PROM / Strength  AROM;PROM;Strength      AROM  AROM Assessment Site  Knee    Right/Left Knee  Right    Right Knee Extension  13    Right Knee Flexion  105      PROM   PROM Assessment Site  Knee    Right/Left Knee  Right    Right Knee Extension  0    Right Knee Flexion  113      Strength   Overall Strength Comments  4-/5 with mild pain      Flexibility   Soft Tissue Assessment /Muscle Length  yes    Hamstrings  tight    Quadriceps  tight    ITB  tight    Piriformis  tight      Palpation   Palpation comment  has lateral tracking patella, decreased VMO      Ambulation/Gait   Gait Comments  no device but with large DonJoy brace on, antalgic on the right, some trendelenberg type gait on the right, c/o pain with stance phase                Objective measurements completed on examination: See above findings.      [place] Adult PT Treatment/Exercise - 02/06/19 0001      Exercises   Exercises  Knee/Hip      Knee/Hip Exercises: Aerobic   Recumbent Bike  x 4 minutes    Nustep  level 4 x 5 minutes               PT Short Term Goals - 02/06/19 1416      PT SHORT TERM GOAL #1   Title  independent with initial HEP    Time  2     Period  Weeks    Status  New        PT Long Term Goals - 02/06/19 1416      PT LONG TERM GOAL #1   Title  able to go up and down stairs step over step    Time  8    Period  Weeks    Status  New      PT LONG TERM GOAL #2   Title  decrease pain 50% with activity    Time  8    Period  Weeks    Status  New      PT LONG TERM GOAL #3   Title  walk without deviation    Time  8    Period  Weeks    Status  New      PT LONG TERM GOAL #4   Title  increase AROM to WNL's    Time  8    Period  Weeks    Status  New             Plan - 02/06/19 1410    Clinical Impression Statement  Patient underwent a right knee scope with meniscal debridement and a chondroplasty on 11/30/18.  She reports 6 weeks of non weight bearing and using crutches, reports that after that she got a large brace for her knee and then used a walker for the past two weeks.  She has a slight limitaiton in ROM.  She has pain with ROM/stregnth test and any weight bearing.  She is not using any device except for the brace for walking, has a limp and trendelenberg pattern on the right..  The patella is tracking laterally, with weak VMO due to a lack of TKE    Personal Factors and Comorbidities  Fitness    Examination-Activity Limitations  Stand;Stairs;Lift;Locomotion Level;Squat    Examination-Participation Restrictions  Cleaning;Yard Work    Public affairs consultant  Low    Rehab Potential  Good    PT Frequency  2x / week    PT Duration  6 weeks    PT Treatment/Interventions  ADLs/Self Care Home Management;Electrical Stimulation;Cryotherapy;Gait training;Stair training;Functional mobility training;Therapeutic activities;Neuromuscular re-education;Balance training;Therapeutic exercise;Patient/family education;Manual techniques;Vasopneumatic Device    PT Next Visit Plan  slowly add exericses    Consulted and Agree with Plan of Care  Patient        Patient will benefit from skilled therapeutic intervention in order to improve the following deficits and impairments:  Abnormal gait, Decreased balance, Decreased endurance, Decreased mobility, Difficulty walking, Cardiopulmonary status limiting activity, Decreased range of motion, Decreased activity tolerance, Decreased strength, Impaired flexibility, Pain  Visit Diagnosis: Acute pain of right knee - Plan: PT plan of care cert/re-cert  Stiffness of right knee, not elsewhere classified - Plan: PT plan of care cert/re-cert  Difficulty in walking, not elsewhere classified - Plan: PT plan of care cert/re-cert     Problem List There are no active problems to display for this patient.   Sumner Boast., PT 02/06/2019, 2:20 PM  [place], [place], [address] Phone: [phone]   Fax:  [phone]  Name: THRESIA RAMANATHAN MRN: [id number] Date of Birth: [birth year]

## 2019-02-13 ENCOUNTER — Ambulatory Visit: Payer: 59 | Attending: Orthopedic Surgery | Admitting: Physical Therapy

## 2019-02-13 ENCOUNTER — Other Ambulatory Visit: Payer: Self-pay

## 2019-02-13 ENCOUNTER — Encounter: Payer: Self-pay | Admitting: Physical Therapy

## 2019-02-13 DIAGNOSIS — M25561 Pain in right knee: Secondary | ICD-10-CM | POA: Diagnosis not present

## 2019-02-13 DIAGNOSIS — M25661 Stiffness of right knee, not elsewhere classified: Secondary | ICD-10-CM | POA: Insufficient documentation

## 2019-02-13 DIAGNOSIS — R262 Difficulty in walking, not elsewhere classified: Secondary | ICD-10-CM | POA: Diagnosis not present

## 2019-02-13 NOTE — Therapy (Signed)
[place], [place], [address] Phone: [phone]   Fax:  [phone]  Physical Therapy Treatment  Patient Details  Name: Carly Rogers MRN: [id number] Date of Birth: [birth year] Referring Provider (PT): Andee Poles   Encounter Date: 02/13/2019  PT End of Session - 02/13/19 1428    Visit Number  2    Date for PT Re-Evaluation  04/08/19    PT Start Time  3267    PT Stop Time  1425    PT Time Calculation (min)  40 min    Activity Tolerance  Patient tolerated treatment well    Behavior During Therapy  [facility] for tasks assessed/performed       History reviewed. No pertinent past medical history.  Past Surgical History:  Procedure Laterality Date  . CHOLECYSTECTOMY    . GASTRIC BYPASS    . TUBAL LIGATION      There were no vitals filed for this visit.  Subjective Assessment - 02/13/19 1348    Subjective  "Better, not feeling as much pain"    Currently in Pain?  No/denies    Pain Score  0-No pain                       [facility] Adult PT Treatment/Exercise - 02/13/19 0001      Knee/Hip Exercises: Aerobic   Recumbent Bike  x 4 minutes    Nustep  level 3 x 6 minutes      Knee/Hip Exercises: Machines for Strengthening   Total Gym Leg Press  20lb 2x10       Knee/Hip Exercises: Standing   Heel Raises  Both;2 sets;15 reps;2 seconds    Forward Step Up  Right;2 sets;5 reps;Hand Hold: 0;Step Height: 4"      Knee/Hip Exercises: Seated   Long Arc Quad  Right;10 reps;3 sets    [employer] Weight  3 lbs.    Other Seated Knee/Hip Exercises  Quad sets with manual resistance 2x10 RLE    Hamstring Curl  Right;2 sets;15 reps    Hamstring Limitations  green tband                PT Short Term Goals - 02/13/19 1429      PT SHORT TERM GOAL #1   Title  independent with initial HEP    Status  Achieved        PT Long Term Goals - 02/13/19 1429      PT LONG TERM GOAL #1   Title   able to go up and down stairs step over step    Status  On-going      PT LONG TERM GOAL #2   Title  decrease pain 50% with activity    Status  Partially Met            Plan - 02/13/19 1430    Clinical Impression Statement  Pt ambulated in clinic without brace. Pt tolerated an initial progression to TE well. RLK did fatigue with seated HS curls. R knee pain reported during the eccentric phase of step ups and quad sets.     Examination-Activity Limitations  Stand;Stairs;Lift;Locomotion Level;Squat    Examination-Participation Restrictions  Cleaning;Yard Work    Stability/Clinical Decision Making  Evolving/Moderate complexity    Rehab Potential  Good    PT Frequency  2x / week    PT Duration  6 weeks    PT Treatment/Interventions  ADLs/Self  Care Home Management;Electrical Stimulation;Cryotherapy;Gait training;Stair training;Functional mobility training;Therapeutic activities;Neuromuscular re-education;Balance training;Therapeutic exercise;Patient/family education;Manual techniques;Vasopneumatic Device    PT Next Visit Plan  slowly add exercises       Patient will benefit from skilled therapeutic intervention in order to improve the following deficits and impairments:  Abnormal gait, Decreased balance, Decreased endurance, Decreased mobility, Difficulty walking, Cardiopulmonary status limiting activity, Decreased range of motion, Decreased activity tolerance, Decreased strength, Impaired flexibility, Pain  Visit Diagnosis: Acute pain of right knee  Stiffness of right knee, not elsewhere classified  Difficulty in walking, not elsewhere classified     Problem List There are no active problems to display for this patient.   Scot Jun, PTA 02/13/2019, 2:37 PM  [place], [place], [address] Phone: [phone]   Fax:  [phone]  Name: Carly Rogers MRN: [id number] Date of Birth:  11/08/1970

## 2019-02-20 ENCOUNTER — Encounter: Payer: Self-pay | Admitting: Physical Therapy

## 2019-02-20 ENCOUNTER — Other Ambulatory Visit: Payer: Self-pay

## 2019-02-20 ENCOUNTER — Ambulatory Visit: Payer: 59 | Admitting: Physical Therapy

## 2019-02-20 DIAGNOSIS — M25561 Pain in right knee: Secondary | ICD-10-CM

## 2019-02-20 DIAGNOSIS — Z113 Encounter for screening for infections with a predominantly sexual mode of transmission: Secondary | ICD-10-CM | POA: Diagnosis not present

## 2019-02-20 DIAGNOSIS — R262 Difficulty in walking, not elsewhere classified: Secondary | ICD-10-CM

## 2019-02-20 DIAGNOSIS — Z01411 Encounter for gynecological examination (general) (routine) with abnormal findings: Secondary | ICD-10-CM | POA: Diagnosis not present

## 2019-02-20 DIAGNOSIS — N920 Excessive and frequent menstruation with regular cycle: Secondary | ICD-10-CM | POA: Diagnosis not present

## 2019-02-20 DIAGNOSIS — M25661 Stiffness of right knee, not elsewhere classified: Secondary | ICD-10-CM

## 2019-02-20 DIAGNOSIS — N898 Other specified noninflammatory disorders of vagina: Secondary | ICD-10-CM | POA: Diagnosis not present

## 2019-02-20 DIAGNOSIS — Z30015 Encounter for initial prescription of vaginal ring hormonal contraceptive: Secondary | ICD-10-CM | POA: Diagnosis not present

## 2019-02-20 NOTE — Therapy (Signed)
[place], [place], [address] Phone: [phone]   Fax:  [phone]  Physical Therapy Treatment  Patient Details  Name: Carly Rogers MRN: [id number] Date of Birth: 07/10/1971 Referring Provider (PT): Andee Poles   Encounter Date: 02/20/2019  PT End of Session - 02/20/19 1429    Visit Number  3    Date for PT Re-Evaluation  04/08/19    PT Start Time  1638    PT Stop Time  1428    PT Time Calculation (min)  43 min    Activity Tolerance  Patient tolerated treatment well    Behavior During Therapy  [facility] for tasks assessed/performed       History reviewed. No pertinent past medical history.  Past Surgical History:  Procedure Laterality Date  . CHOLECYSTECTOMY    . GASTRIC BYPASS    . TUBAL LIGATION      There were no vitals filed for this visit.  Subjective Assessment - 02/20/19 1348    Subjective  "I am fine just a little tired"    Currently in Pain?  Yes    Pain Score  2     Pain Location  Knee    Pain Orientation  Right                       [facility] Adult PT Treatment/Exercise - 02/20/19 0001      Knee/Hip Exercises: Aerobic   Recumbent Bike  x 4 minutes    Nustep  level 4 x 6 minutes      Knee/Hip Exercises: Machines for Strengthening   Cybex Knee Extension  10lb 2x10     Cybex Knee Flexion  25lb 2x10       Knee/Hip Exercises: Standing   Heel Raises  Both;2 sets;15 reps;2 seconds    Forward Step Up  Right;2 sets;Step Height: 4";10 reps;Step Height: 6"    Other Standing Knee Exercises  Ball on wall TKE 2x10    Other Standing Knee Exercises  Resisted gait 30lb 4 way x 3 each      Knee/Hip Exercises: Seated   Sit to Sand  2 sets;10 reps;without UE support               PT Short Term Goals - 02/13/19 1429      PT SHORT TERM GOAL #1   Title  independent with initial HEP    Status  Achieved        PT Long Term Goals - 02/20/19 1430      PT LONG TERM  GOAL #1   Title  able to go up and down stairs step over step    Status  On-going      PT LONG TERM GOAL #2   Title  decrease pain 50% with activity    Status  Partially Met      PT LONG TERM GOAL #3   Title  walk without deviation    Status  On-going            Plan - 02/20/19 1430    Clinical Impression Statement  Pt did well with a progressed treatment session. She did well with all the machine level interventions. Some compensation and fatigue with sit to stands. She does reports some R knee pain the eccentric phase of step ups and with TKE ball on wall.  Some instability noted with resisted side steps.     Examination-Activity  Limitations  Stand;Stairs;Lift;Locomotion Level;Squat    Examination-Participation Restrictions  Cleaning;Yard Work    Merchant navy officer  Evolving/Moderate complexity    Rehab Potential  Good    PT Frequency  2x / week    PT Duration  6 weeks    PT Treatment/Interventions  ADLs/Self Care Home Management;Electrical Stimulation;Cryotherapy;Gait training;Stair training;Functional mobility training;Therapeutic activities;Neuromuscular re-education;Balance training;Therapeutic exercise;Patient/family education;Manual techniques;Vasopneumatic Device    PT Next Visit Plan  slowly add exercises       Patient will benefit from skilled therapeutic intervention in order to improve the following deficits and impairments:  Abnormal gait, Decreased balance, Decreased endurance, Decreased mobility, Difficulty walking, Cardiopulmonary status limiting activity, Decreased range of motion, Decreased activity tolerance, Decreased strength, Impaired flexibility, Pain  Visit Diagnosis: Acute pain of right knee  Stiffness of right knee, not elsewhere classified  Difficulty in walking, not elsewhere classified     Problem List There are no active problems to display for this patient.   Scot Jun, PTA 02/20/2019, 2:38 PM  [place], [place], [address] Phone: [phone]   Fax:  [phone]  Name: Carly Rogers MRN: [id number] Date of Birth: 05-13-1971

## 2019-02-23 ENCOUNTER — Ambulatory Visit: Payer: 59 | Admitting: Neurology

## 2019-03-01 ENCOUNTER — Other Ambulatory Visit: Payer: Self-pay

## 2019-03-01 ENCOUNTER — Encounter: Payer: Self-pay | Admitting: Neurology

## 2019-03-01 ENCOUNTER — Encounter: Payer: Self-pay | Admitting: Physical Therapy

## 2019-03-01 ENCOUNTER — Ambulatory Visit: Payer: 59 | Admitting: Physical Therapy

## 2019-03-01 DIAGNOSIS — M25561 Pain in right knee: Secondary | ICD-10-CM | POA: Diagnosis not present

## 2019-03-01 DIAGNOSIS — R262 Difficulty in walking, not elsewhere classified: Secondary | ICD-10-CM | POA: Diagnosis not present

## 2019-03-01 DIAGNOSIS — M25661 Stiffness of right knee, not elsewhere classified: Secondary | ICD-10-CM | POA: Diagnosis not present

## 2019-03-01 NOTE — Therapy (Signed)
[place], [place], [address] Phone: [phone]   Fax:  [phone]  Physical Therapy Treatment  Patient Details  Name: Carly Rogers MRN: [id number] Date of Birth: [birth year] Referring Provider (PT): Andee Poles   Encounter Date: 03/01/2019  PT End of Session - 03/01/19 1228    Visit Number  4    Date for PT Re-Evaluation  04/08/19    PT Start Time  6720    PT Stop Time  1228    PT Time Calculation (min)  43 min    Activity Tolerance  Patient tolerated treatment well    Behavior During Therapy  [facility] for tasks assessed/performed       History reviewed. No pertinent past medical history.  Past Surgical History:  Procedure Laterality Date  . CHOLECYSTECTOMY    . GASTRIC BYPASS    . TUBAL LIGATION      There were no vitals filed for this visit.  Subjective Assessment - 03/01/19 1149    Subjective  "Im good"    Currently in Pain?  No/denies    Pain Score  0-No pain                       [facility] Adult PT Treatment/Exercise - 03/01/19 0001      Knee/Hip Exercises: Aerobic   Recumbent Bike  x 4 minutes L1     Nustep  level 4 x 6 minutes      Knee/Hip Exercises: Machines for Strengthening   Cybex Knee Extension  10lb 2x10     Cybex Knee Flexion  25lb 2x10     Total Gym Leg Press  20lb 2x15       Knee/Hip Exercises: Standing   Heel Raises  Both;2 sets;15 reps;2 seconds    Lateral Step Up  Left;2 sets;10 reps;Hand Hold: 0;Step Height: 6"    Forward Step Up  Right;10 reps;Step Height: 6";1 set    Functional Squat  2 sets;10 reps;3 seconds   4lb dumbbell    Other Standing Knee Exercises  Resisted gait 30lb 4 way x 3 each               PT Short Term Goals - 02/13/19 1429      PT SHORT TERM GOAL #1   Title  independent with initial HEP    Status  Achieved        PT Long Term Goals - 03/01/19 1231      PT LONG TERM GOAL #1   Title  able to go up and down  stairs step over step    Status  On-going      PT LONG TERM GOAL #2   Title  decrease pain 50% with activity    Status  Achieved      PT LONG TERM GOAL #3   Title  walk without deviation    Status  Partially Met      PT LONG TERM GOAL #4   Title  increase AROM to WNL's    Status  On-going            Plan - 03/01/19 1231    Clinical Impression Statement  Pt repots that she is feeling better overall. She reports being able to cook at home without issue. She did well with today's exercises only showing signs of fatigue. Some weakness and instability notes with resisted gait. Pt with some compensation with squats.  Examination-Activity Limitations  Stand;Stairs;Lift;Locomotion Level;Squat    Examination-Participation Restrictions  Cleaning;Yard Work    Rehab Potential  Good    PT Frequency  2x / week    PT Duration  6 weeks    PT Treatment/Interventions  ADLs/Self Care Home Management;Electrical Stimulation;Cryotherapy;Gait training;Stair training;Functional mobility training;Therapeutic activities;Neuromuscular re-education;Balance training;Therapeutic exercise;Patient/family education;Manual techniques;Vasopneumatic Device    PT Next Visit Plan  slowly add exericses       Patient will benefit from skilled therapeutic intervention in order to improve the following deficits and impairments:  Abnormal gait, Decreased balance, Decreased endurance, Decreased mobility, Difficulty walking, Cardiopulmonary status limiting activity, Decreased range of motion, Decreased activity tolerance, Decreased strength, Impaired flexibility, Pain  Visit Diagnosis: Acute pain of right knee  Stiffness of right knee, not elsewhere classified  Difficulty in walking, not elsewhere classified     Problem List There are no active problems to display for this patient.   Scot Jun, PTA 03/01/2019, 12:34 PM  [place]  [place], [place], [address] Phone: [phone]   Fax:  [phone]  Name: Carly Rogers MRN: [id number] Date of Birth: Mar 19, 1971

## 2019-03-02 ENCOUNTER — Ambulatory Visit: Payer: 59 | Admitting: Neurology

## 2019-03-07 ENCOUNTER — Other Ambulatory Visit: Payer: Self-pay

## 2019-03-07 ENCOUNTER — Ambulatory Visit: Payer: 59 | Admitting: Physical Therapy

## 2019-03-07 ENCOUNTER — Encounter: Payer: Self-pay | Admitting: Physical Therapy

## 2019-03-07 DIAGNOSIS — R262 Difficulty in walking, not elsewhere classified: Secondary | ICD-10-CM | POA: Diagnosis not present

## 2019-03-07 DIAGNOSIS — M25561 Pain in right knee: Secondary | ICD-10-CM

## 2019-03-07 DIAGNOSIS — M25661 Stiffness of right knee, not elsewhere classified: Secondary | ICD-10-CM | POA: Diagnosis not present

## 2019-03-07 NOTE — Therapy (Signed)
[place], [place], [address] Phone: [phone]   Fax:  [phone]  Physical Therapy Treatment  Patient Details  Name: Carly Rogers MRN: [id number] Date of Birth: 1971/04/22 Referring Provider (PT): Andee Poles   Encounter Date: 03/07/2019  PT End of Session - 03/07/19 1009    Visit Number  5    Date for PT Re-Evaluation  04/08/19    PT Start Time  0930    PT Stop Time  1010    PT Time Calculation (min)  40 min    Activity Tolerance  Patient tolerated treatment well    Behavior During Therapy  [facility] for tasks assessed/performed       History reviewed. No pertinent past medical history.  Past Surgical History:  Procedure Laterality Date  . CHOLECYSTECTOMY    . GASTRIC BYPASS    . TUBAL LIGATION      There were no vitals filed for this visit.  Subjective Assessment - 03/07/19 0933    Subjective  "It has been during since last week"    Currently in Pain?  Yes    Pain Score  5     Pain Location  Knee    Pain Orientation  Right         [facility] PT Assessment - 03/07/19 0001      AROM   Right Knee Extension  0    Right Knee Flexion  119                   [facility] Adult PT Treatment/Exercise - 03/07/19 0001      Knee/Hip Exercises: Aerobic   Elliptical  I3 R3 x4 min     Recumbent Bike  x 5 minutes L1       Knee/Hip Exercises: Machines for Strengthening   Cybex Knee Extension  10lb 2x10     Cybex Knee Flexion  25lb 2x10     Cybex Leg Press  30lb  2x10       Knee/Hip Exercises: Standing   Forward Step Up  Right;10 reps;Step Height: 6";1 set      Knee/Hip Exercises: Seated   Sit to Sand  2 sets;10 reps;without UE support   blue chair               PT Short Term Goals - 02/13/19 1429      PT SHORT TERM GOAL #1   Title  independent with initial HEP    Status  Achieved        PT Long Term Goals - 03/07/19 0956      PT LONG TERM GOAL #4   Title  increase  AROM to WNL's    Status  Achieved            Plan - 03/07/19 1009    Clinical Impression Statement  Pt was able to complete all of today's interventions despite reports of pain. backed down some reps on leg press and only did one exercises with step ups. Pt stated that step ups cause the most pain during the eccentric phase. Pt has progressed with R knee AROM.     Personal Factors and Comorbidities  Fitness    Examination-Activity Limitations  Stand;Stairs;Lift;Locomotion Level;Squat    Examination-Participation Restrictions  Cleaning;Yard Work    Stability/Clinical Decision Making  Evolving/Moderate complexity    Rehab Potential  Good    PT Treatment/Interventions  ADLs/Self Care Home Management;Electrical Stimulation;Cryotherapy;Gait training;Stair training;Functional mobility training;Therapeutic  activities;Neuromuscular re-education;Balance training;Therapeutic exercise;Patient/family education;Manual techniques;Vasopneumatic Device    PT Next Visit Plan  slowly add exericses       Patient will benefit from skilled therapeutic intervention in order to improve the following deficits and impairments:  Abnormal gait, Decreased balance, Decreased endurance, Decreased mobility, Difficulty walking, Cardiopulmonary status limiting activity, Decreased range of motion, Decreased activity tolerance, Decreased strength, Impaired flexibility, Pain  Visit Diagnosis: Acute pain of right knee  Stiffness of right knee, not elsewhere classified  Difficulty in walking, not elsewhere classified     Problem List There are no active problems to display for this patient.   Scot Jun, PTA 03/07/2019, 10:11 AM  [place], [place], [address] Phone: [phone]   Fax:  [phone]  Name: Carly Rogers MRN: [id number] Date of Birth: [birth year]

## 2019-03-12 ENCOUNTER — Ambulatory Visit: Payer: 59 | Admitting: Physical Therapy

## 2019-03-13 ENCOUNTER — Ambulatory Visit: Payer: 59 | Attending: Orthopedic Surgery | Admitting: Physical Therapy

## 2019-03-13 ENCOUNTER — Encounter: Payer: Self-pay | Admitting: Physical Therapy

## 2019-03-13 ENCOUNTER — Other Ambulatory Visit: Payer: Self-pay

## 2019-03-13 DIAGNOSIS — M25561 Pain in right knee: Secondary | ICD-10-CM | POA: Diagnosis not present

## 2019-03-13 DIAGNOSIS — R262 Difficulty in walking, not elsewhere classified: Secondary | ICD-10-CM | POA: Diagnosis not present

## 2019-03-13 DIAGNOSIS — M25661 Stiffness of right knee, not elsewhere classified: Secondary | ICD-10-CM | POA: Diagnosis not present

## 2019-03-13 NOTE — Therapy (Signed)
[place], [place], [address] Phone: [phone]   Fax:  [phone]  Physical Therapy Evaluation  Patient Details  Name: Carly Rogers MRN: [id number] Date of Birth: [birth year] Referring Provider (PT): Andee Poles   Encounter Date: 03/13/2019  PT End of Session - 03/13/19 1448    Visit Number  6    Date for PT Re-Evaluation  04/08/19    PT Start Time  0930    PT Stop Time  1011    PT Time Calculation (min)  41 min    Activity Tolerance  Patient tolerated treatment well    Behavior During Therapy  [facility] for tasks assessed/performed       History reviewed. No pertinent past medical history.  Past Surgical History:  Procedure Laterality Date  . CHOLECYSTECTOMY    . GASTRIC BYPASS    . TUBAL LIGATION      There were no vitals filed for this visit.   Subjective Assessment - 03/13/19 0939    Subjective  "I am ok"    Currently in Pain?  Yes    Pain Score  3     Pain Location  Knee    Pain Orientation  Right                    Objective measurements completed on examination: See above findings.      [facility] Adult PT Treatment/Exercise - 03/13/19 0001      Knee/Hip Exercises: Aerobic   Elliptical  I5 R3 x4 min     Recumbent Bike  x 4 minutes L1       Knee/Hip Exercises: Machines for Strengthening   Cybex Knee Extension  RLE 5lb 2x10     Cybex Knee Flexion  25lb 3x10; RLE 15lb 2x10      Cybex Leg Press  40lb  3x10       Knee/Hip Exercises: Standing   Heel Raises  Both;2 sets;15 reps;2 seconds    Lateral Step Up  10 reps;Hand Hold: 0;Step Height: 6";Right;1 set    Forward Step Up  Right;10 reps;Step Height: 6";1 set    Other Standing Knee Exercises  Resisted side step 40lb x 4 each               PT Short Term Goals - 02/13/19 1429      PT SHORT TERM GOAL #1   Title  independent with initial HEP    Status  Achieved        PT Long Term Goals - 03/13/19 0944       PT LONG TERM GOAL #3   Title  walk without deviation    Status  Partially Met             Plan - 03/13/19 1015    Clinical Impression Statement  Pt tolerated today's treatment well evident by no subjective reports of increase pain. She was able to increase reps and or weight with all machine level strengthening. Decrease pain with step up interventions compared to last visit.    Stability/Clinical Decision Making  Evolving/Moderate complexity    Rehab Potential  Good    PT Frequency  2x / week    PT Duration  6 weeks    PT Treatment/Interventions  ADLs/Self Care Home Management;Electrical Stimulation;Cryotherapy;Gait training;Stair training;Functional mobility training;Therapeutic activities;Neuromuscular re-education;Balance training;Therapeutic exercise;Patient/family education;Manual techniques;Vasopneumatic Device    PT Next Visit Plan  R quad and HS strength. Functional strengthening  Patient will benefit from skilled therapeutic intervention in order to improve the following deficits and impairments:  Abnormal gait, Decreased balance, Decreased endurance, Decreased mobility, Difficulty walking, Cardiopulmonary status limiting activity, Decreased range of motion, Decreased activity tolerance, Decreased strength, Impaired flexibility, Pain  Visit Diagnosis: Stiffness of right knee, not elsewhere classified  Acute pain of right knee  Difficulty in walking, not elsewhere classified     Problem List There are no active problems to display for this patient.   Scot Jun, PTA 03/13/2019, 10:22 AM  [place] [address] [place], [place], [address] Phone: [phone]   Fax:  [phone]  Name: Carly Rogers MRN: [id number] Date of Birth: 09/01/1971

## 2019-03-14 ENCOUNTER — Ambulatory Visit: Payer: 59 | Admitting: Physical Therapy

## 2019-03-20 ENCOUNTER — Ambulatory Visit: Payer: 59 | Admitting: Physical Therapy

## 2019-03-20 ENCOUNTER — Other Ambulatory Visit: Payer: Self-pay

## 2019-03-20 ENCOUNTER — Encounter: Payer: Self-pay | Admitting: Physical Therapy

## 2019-03-20 DIAGNOSIS — M1711 Unilateral primary osteoarthritis, right knee: Secondary | ICD-10-CM | POA: Diagnosis not present

## 2019-03-20 DIAGNOSIS — M25561 Pain in right knee: Secondary | ICD-10-CM | POA: Diagnosis not present

## 2019-03-20 DIAGNOSIS — R262 Difficulty in walking, not elsewhere classified: Secondary | ICD-10-CM

## 2019-03-20 DIAGNOSIS — M25661 Stiffness of right knee, not elsewhere classified: Secondary | ICD-10-CM

## 2019-03-20 DIAGNOSIS — Z9889 Other specified postprocedural states: Secondary | ICD-10-CM | POA: Diagnosis not present

## 2019-03-20 NOTE — Therapy (Signed)
[place], [place], [address] Phone: [phone]   Fax:  [phone]  Physical Therapy Treatment  Patient Details  Name: VANITA CANNELL MRN: [id number] Date of Birth: 1971/09/05 Referring Provider (PT): Andee Poles   Encounter Date: 03/20/2019  PT End of Session - 03/20/19 1449    Visit Number  7    Date for PT Re-Evaluation  04/08/19    PT Start Time  1400    PT Stop Time  1445    PT Time Calculation (min)  45 min    Activity Tolerance  Patient tolerated treatment well    Behavior During Therapy  [facility] for tasks assessed/performed       History reviewed. No pertinent past medical history.  Past Surgical History:  Procedure Laterality Date  . CHOLECYSTECTOMY    . GASTRIC BYPASS    . TUBAL LIGATION      There were no vitals filed for this visit.  Subjective Assessment - 03/20/19 1404    Subjective  "I over worked my knee this weekend so it is hurting today" Pt reports that she was working in her yard this weekend    Currently in Pain?  Yes    Pain Score  3     Pain Location  Knee    Pain Orientation  Right                       [facility] Adult PT Treatment/Exercise - 03/20/19 0001      Knee/Hip Exercises: Aerobic   Elliptical  I5 R3 x4 min     Nustep  level 5 x 6 minutes      Knee/Hip Exercises: Machines for Strengthening   Cybex Knee Extension  RLE 5lb 2x10     Cybex Knee Flexion  25lb 2x10; RLE 15lb 2x10      Cybex Leg Press  40lb  3x10                PT Short Term Goals - 02/13/19 1429      PT SHORT TERM GOAL #1   Title  independent with initial HEP    Status  Achieved        PT Long Term Goals - 03/13/19 0944      PT LONG TERM GOAL #3   Title  walk without deviation    Status  Partially Met            Plan - 03/20/19 1508    Clinical Impression Statement  Pt reports that she may have over worked her knee this weekend working in the yard. She  is progressing towards all goals. increase weight tolerated on leg press and she was able to complete step ups from a higher box. Eccentric load weakness with RLE noted with stair negotiation. Some pain and instability noted with resisted side steps.       Examination-Activity Limitations  Stand;Stairs;Lift;Locomotion Level;Squat    Examination-Participation Restrictions  Cleaning;Yard Work    Rehab Potential  Good    PT Frequency  2x / week    PT Duration  6 weeks    PT Treatment/Interventions  ADLs/Self Care Home Management;Electrical Stimulation;Cryotherapy;Gait training;Stair training;Functional mobility training;Therapeutic activities;Neuromuscular re-education;Balance training;Therapeutic exercise;Patient/family education;Manual techniques;Vasopneumatic Device    PT Next Visit Plan  R quad and HS strength. Functional strengthening       Patient will benefit from skilled therapeutic intervention in order to improve the following deficits  and impairments:  Abnormal gait, Decreased balance, Decreased endurance, Decreased mobility, Difficulty walking, Cardiopulmonary status limiting activity, Decreased range of motion, Decreased activity tolerance, Decreased strength, Impaired flexibility, Pain  Visit Diagnosis: Stiffness of right knee, not elsewhere classified  Acute pain of right knee  Difficulty in walking, not elsewhere classified     Problem List There are no active problems to display for this patient.   Scot Jun, PTA 03/20/2019, 3:11 PM  [place], [place], [address] Phone: [phone]   Fax:  [phone]  Name: JACI DESANTO MRN: [id number] Date of Birth: 1971-04-17

## 2019-03-27 ENCOUNTER — Ambulatory Visit: Payer: 59 | Admitting: Physical Therapy

## 2019-03-27 ENCOUNTER — Encounter: Payer: Self-pay | Admitting: Physical Therapy

## 2019-03-27 ENCOUNTER — Other Ambulatory Visit: Payer: Self-pay

## 2019-03-27 DIAGNOSIS — R262 Difficulty in walking, not elsewhere classified: Secondary | ICD-10-CM

## 2019-03-27 DIAGNOSIS — M25661 Stiffness of right knee, not elsewhere classified: Secondary | ICD-10-CM | POA: Diagnosis not present

## 2019-03-27 DIAGNOSIS — M25561 Pain in right knee: Secondary | ICD-10-CM

## 2019-03-27 NOTE — Therapy (Signed)
[place], [place], [address] Phone: [phone]   Fax:  [phone]  Physical Therapy Treatment  Patient Details  Name: Carly Rogers MRN: [id number] Date of Birth: 1970-10-24 Referring Provider (PT): Andee Poles   Encounter Date: 03/27/2019  PT End of Session - 03/27/19 1344    Visit Number  8    Date for PT Re-Evaluation  04/08/19    PT Start Time  1300    PT Stop Time  1345    PT Time Calculation (min)  45 min    Activity Tolerance  Patient tolerated treatment well    Behavior During Therapy  [facility] for tasks assessed/performed       History reviewed. No pertinent past medical history.  Past Surgical History:  Procedure Laterality Date  . CHOLECYSTECTOMY    . GASTRIC BYPASS    . TUBAL LIGATION      There were no vitals filed for this visit.  Subjective Assessment - 03/27/19 1307    Subjective  "No too bad today"    Currently in Pain?  Yes    Pain Score  3     Pain Location  Knee    Pain Orientation  Right                       [facility] Adult PT Treatment/Exercise - 03/27/19 0001      Ambulation/Gait   Stairs  Yes    Stairs Assistance  7: Independent;6: Modified independent (Device/Increase time)    Stair Management Technique  No rails;One rail Right    Number of Stairs  48    Height of Stairs  6      Knee/Hip Exercises: Aerobic   Elliptical  I7 R4 x4 min     Recumbent Bike  x 5 minutes L1       Knee/Hip Exercises: Machines for Strengthening   Cybex Knee Extension  15lb 2x10, RLE 5lb 2x10     Cybex Knee Flexion  25lb 2x15; RLE 15lb 2x10      Cybex Leg Press  50lb  2x15       Knee/Hip Exercises: Standing   Walking with Sports Cord  50lb 4 way x 4 each       Knee/Hip Exercises: Seated   Sit to Sand  2 sets;10 reps;without UE support   holding yellow ball               PT Short Term Goals - 02/13/19 1429      PT SHORT TERM GOAL #1   Title   independent with initial HEP    Status  Achieved        PT Long Term Goals - 03/27/19 1314      PT LONG TERM GOAL #2   Title  decrease pain 50% with activity    Status  Achieved      PT LONG TERM GOAL #3   Title  walk without deviation    Status  Partially Met      PT LONG TERM GOAL #4   Title  increase AROM to WNL's    Status  Achieved            Plan - 03/27/19 1345    Clinical Impression Statement  Pt is doing well and is progressing towards all goals. Some eccentric load weakness in RLE remain noted when descending stairs. All machine level interventions completed well. She did  show some RLE weakness with leg extensions. Some compensation noted with sit to stands.    Examination-Activity Limitations  Stand;Stairs;Lift;Locomotion Level;Squat    Stability/Clinical Decision Making  Evolving/Moderate complexity    PT Frequency  2x / week    PT Duration  6 weeks    PT Treatment/Interventions  ADLs/Self Care Home Management;Electrical Stimulation;Cryotherapy;Gait training;Stair training;Functional mobility training;Therapeutic activities;Neuromuscular re-education;Balance training;Therapeutic exercise;Patient/family education;Manual techniques;Vasopneumatic Device    PT Next Visit Plan  R quad and HS strenght. Functional strengthening       Patient will benefit from skilled therapeutic intervention in order to improve the following deficits and impairments:  Abnormal gait, Decreased balance, Decreased endurance, Decreased mobility, Difficulty walking, Cardiopulmonary status limiting activity, Decreased range of motion, Decreased activity tolerance, Decreased strength, Impaired flexibility, Pain  Visit Diagnosis: 1. Acute pain of right knee   2. Difficulty in walking, not elsewhere classified   3. Stiffness of right knee, not elsewhere classified        Problem List There are no active problems to display for this patient.   Scot Jun, PTA 03/27/2019, 1:47  PM  [place], [place], [address] Phone: [phone]   Fax:  [phone]  Name: Carly Rogers MRN: [id number] Date of Birth: 01-Jul-1971

## 2019-03-28 ENCOUNTER — Emergency Department (HOSPITAL_BASED_OUTPATIENT_CLINIC_OR_DEPARTMENT_OTHER)
Admission: EM | Admit: 2019-03-28 | Discharge: 2019-03-28 | Disposition: A | Discharge status: Home / Self Care | Payer: 59 | Attending: Emergency Medicine | Admitting: Emergency Medicine

## 2019-03-28 ENCOUNTER — Telehealth: Payer: 59 | Admitting: Family

## 2019-03-28 ENCOUNTER — Emergency Department (HOSPITAL_BASED_OUTPATIENT_CLINIC_OR_DEPARTMENT_OTHER): Payer: 59

## 2019-03-28 ENCOUNTER — Telehealth: Payer: Self-pay

## 2019-03-28 ENCOUNTER — Other Ambulatory Visit: Payer: Self-pay

## 2019-03-28 ENCOUNTER — Encounter (HOSPITAL_BASED_OUTPATIENT_CLINIC_OR_DEPARTMENT_OTHER): Payer: Self-pay

## 2019-03-28 DIAGNOSIS — R51 Headache: Secondary | ICD-10-CM | POA: Diagnosis not present

## 2019-03-28 DIAGNOSIS — Z79899 Other long term (current) drug therapy: Secondary | ICD-10-CM | POA: Insufficient documentation

## 2019-03-28 DIAGNOSIS — B349 Viral infection, unspecified: Secondary | ICD-10-CM | POA: Insufficient documentation

## 2019-03-28 DIAGNOSIS — Z20828 Contact with and (suspected) exposure to other viral communicable diseases: Secondary | ICD-10-CM | POA: Diagnosis not present

## 2019-03-28 DIAGNOSIS — R6883 Chills (without fever): Secondary | ICD-10-CM | POA: Diagnosis present

## 2019-03-28 DIAGNOSIS — Z20822 Contact with and (suspected) exposure to covid-19: Secondary | ICD-10-CM

## 2019-03-28 DIAGNOSIS — R05 Cough: Secondary | ICD-10-CM | POA: Diagnosis not present

## 2019-03-28 LAB — URINALYSIS, MICROSCOPIC (REFLEX): WBC, UA: NONE SEEN WBC/hpf (ref 0–5)

## 2019-03-28 LAB — URINALYSIS, ROUTINE W REFLEX MICROSCOPIC
Bilirubin Urine: NEGATIVE
Glucose, UA: NEGATIVE mg/dL
Ketones, ur: NEGATIVE mg/dL
Leukocytes,Ua: NEGATIVE
Nitrite: NEGATIVE
Protein, ur: NEGATIVE mg/dL
Specific Gravity, Urine: 1.015 (ref 1.005–1.030)
pH: 6 (ref 5.0–8.0)

## 2019-03-28 LAB — PREGNANCY, URINE: Preg Test, Ur: NEGATIVE

## 2019-03-28 MED ORDER — SODIUM CHLORIDE 0.9 % IV BOLUS
1000.0000 mL | Freq: Once | INTRAVENOUS | Status: AC
  Administered 2019-03-28: 1000 mL via INTRAVENOUS

## 2019-03-28 MED ORDER — ONDANSETRON 4 MG PO TBDP: 4.0000 mg | ORAL_TABLET | Freq: Three times a day (TID) | ORAL | 0 refills | Status: AC | PRN

## 2019-03-28 MED ORDER — BENZONATATE 100 MG PO CAPS: 100.0000 mg | ORAL_CAPSULE | Freq: Three times a day (TID) | ORAL | 0 refills | Status: DC | PRN

## 2019-03-28 MED ORDER — ONDANSETRON HCL 4 MG/2ML IJ SOLN
4.0000 mg | Freq: Once | INTRAMUSCULAR | Status: AC
  Administered 2019-03-28: 4 mg via INTRAVENOUS
  Filled 2019-03-28: qty 2

## 2019-03-28 MED ORDER — ALBUTEROL SULFATE HFA 108 (90 BASE) MCG/ACT IN AERS: 2.0000 | INHALATION_SPRAY | Freq: Four times a day (QID) | RESPIRATORY_TRACT | 0 refills | Status: AC | PRN

## 2019-03-28 MED ORDER — BENZONATATE 100 MG PO CAPS: 100.0000 mg | ORAL_CAPSULE | Freq: Three times a day (TID) | ORAL | 0 refills | Status: AC

## 2019-03-28 MED ORDER — ACETAMINOPHEN 325 MG PO TABS
650.0000 mg | ORAL_TABLET | Freq: Once | ORAL | Status: AC
  Administered 2019-03-28: 650 mg via ORAL
  Filled 2019-03-28: qty 2

## 2019-03-28 MED ORDER — PROCHLORPERAZINE EDISYLATE 10 MG/2ML IJ SOLN
10.0000 mg | Freq: Once | INTRAMUSCULAR | Status: DC
  Filled 2019-03-28: qty 2

## 2019-03-28 MED ORDER — FLUTICASONE PROPIONATE 50 MCG/ACT NA SUSP: 1.0000 | Freq: Every day | NASAL | 0 refills | Status: AC

## 2019-03-28 MED ORDER — DIPHENHYDRAMINE HCL 50 MG/ML IJ SOLN
12.5000 mg | Freq: Once | INTRAMUSCULAR | Status: DC
  Filled 2019-03-28: qty 1

## 2019-03-28 NOTE — ED Triage Notes (Signed)
Pt c/o chills, nausea, HA started last night-chart review reads that pt has appt for covid test tomorrow-pt states she "feels too bad to wait"-NAD-steady gait

## 2019-03-28 NOTE — Discharge Instructions (Signed)
You were seen in the emergency department today for chills, body aches, headache, and shortness of breath.  Your head CT was normal.  Your chest x-ray was normal.  Your urine showed some blood in it, had this rechecked by primary care.  Your pregnancy test was negative.  We have sent an outpatient coronavirus test, we will call you with these results within the next 24 to 72 hours.  In the interim we would like you to follow quarantine precautions.  We are sending home with Flonase to help with congestion, Tessalon to help with cough, and Zofran to help with nausea.  Please take these medicines as prescribed.  Please continue to take Tylenol with these medicines for discomfort and fever.  We have prescribed you new medication(s) today. Discuss the medications prescribed today with your pharmacist as they can have adverse effects and interactions with your other medicines including over the counter and prescribed medications. Seek medical evaluation if you start to experience new or abnormal symptoms after taking one of these medicines, seek care immediately if you start to experience difficulty breathing, feeling of your throat closing, facial swelling, or rash as these could be indications of a more serious allergic reaction  We are instructing patients under investigation for covid 19 quarantine for14 days. You may be able to discontinue self quarantine if the following conditions are met:   Persons with COVID-19 who have symptoms and were directed to care for themselves at home may discontinue home isolation under the  following conditions: - It has been at least 7 days have passed since symptoms first appeared. - AND at least 3 days (72 hours) have passed since recovery defined as resolution of fever without the use of fever-reducing medications and improvement in respiratory symptoms (e.g., cough, shortness of breath)  Please follow the below quarantine instructions.   Please follow up with  primary care within 3-5 days for re-evaluation- call prior to going to the office to make them aware of your symptoms. Return to the ER for new or worsening symptoms including but not limited to increased work of breathing, fever, chest pain, passing out, or any other concerns.       Person Under Monitoring Name: Carly Rogers  Location: [address] [place] [address]   Infection Prevention Recommendations for Individuals Confirmed to have, or Being Evaluated for, 2019 Novel Coronavirus (COVID-19) Infection Who Receive Care at Home  Individuals who are confirmed to have, or are being evaluated for, COVID-19 should follow the prevention steps below until a healthcare provider or local or state health department says they can return to normal activities.  Stay home except to get medical care You should restrict activities outside your home, except for getting medical care. Do not go to work, school, or public areas, and do not use public transportation or taxis.  Call ahead before visiting your doctor Before your medical appointment, call the healthcare provider and tell them that you have, or are being evaluated for, COVID-19 infection. This will help the healthcare providers office take steps to keep other people from getting infected. Ask your healthcare provider to call the local or state health department.  Monitor your symptoms Seek prompt medical attention if your illness is worsening (e.g., difficulty breathing). Before going to your medical appointment, call the healthcare provider and tell them that you have, or are being evaluated for, COVID-19 infection. Ask your healthcare provider to call the local or state health department.  Wear a facemask  You should wear a facemask that covers your nose and mouth when you are in the same room with other people and when you visit a healthcare provider. People who live with or visit you should also wear a facemask while they are  in the same room with you.  Separate yourself from other people in your home As much as possible, you should stay in a different room from other people in your home. Also, you should use a separate bathroom, if available.  Avoid sharing household items You should not share dishes, drinking glasses, cups, eating utensils, towels, bedding, or other items with other people in your home. After using these items, you should wash them thoroughly with soap and water.  Cover your coughs and sneezes Cover your mouth and nose with a tissue when you cough or sneeze, or you can cough or sneeze into your sleeve. Throw used tissues in a lined trash can, and immediately wash your hands with soap and water for at least 20 seconds or use an alcohol-based hand rub.  Wash your [facility] your hands often and thoroughly with soap and water for at least 20 seconds. You can use an alcohol-based hand sanitizer if soap and water are not available and if your hands are not visibly dirty. Avoid touching your eyes, nose, and mouth with unwashed hands.   Prevention Steps for Caregivers and Household Members of Individuals Confirmed to have, or Being Evaluated for, COVID-19 Infection Being Cared for in the Home  If you live with, or provide care at home for, a person confirmed to have, or being evaluated for, COVID-19 infection please follow these guidelines to prevent infection:  Follow healthcare providers instructions Make sure that you understand and can help the patient follow any healthcare provider instructions for all care.  Provide for the patients basic needs You should help the patient with basic needs in the home and provide support for getting groceries, prescriptions, and other personal needs.  Monitor the patients symptoms If they are getting sicker, call his or her medical provider and tell them that the patient has, or is being evaluated for, COVID-19 infection. This will help the  healthcare providers office take steps to keep other people from getting infected. Ask the healthcare provider to call the local or state health department.  Limit the number of people who have contact with the patient If possible, have only one caregiver for the patient. Other household members should stay in another home or place of residence. If this is not possible, they should stay in another room, or be separated from the patient as much as possible. Use a separate bathroom, if available. Restrict visitors who do not have an essential need to be in the home.  Keep older adults, very young children, and other sick people away from the patient Keep older adults, very young children, and those who have compromised immune systems or chronic health conditions away from the patient. This includes people with chronic heart, lung, or kidney conditions, diabetes, and cancer.  Ensure good ventilation Make sure that shared spaces in the home have good air flow, such as from an air conditioner or an opened window, weather permitting.  Wash your hands often Wash your hands often and thoroughly with soap and water for at least 20 seconds. You can use an alcohol based hand sanitizer if soap and water are not available and if your hands are not visibly dirty. Avoid touching your eyes, nose, and mouth with unwashed  hands. Use disposable paper towels to dry your hands. If not available, use dedicated cloth towels and replace them when they become wet.  Wear a facemask and gloves Wear a disposable facemask at all times in the room and gloves when you touch or have contact with the patients blood, body fluids, and/or secretions or excretions, such as sweat, saliva, sputum, nasal mucus, vomit, urine, or feces.  Ensure the mask fits over your nose and mouth tightly, and do not touch it during use. Throw out disposable facemasks and gloves after using them. Do not reuse. Wash your hands immediately after  removing your facemask and gloves. If your personal clothing becomes contaminated, carefully remove clothing and launder. Wash your hands after handling contaminated clothing. Place all used disposable facemasks, gloves, and other waste in a lined container before disposing them with other household waste. Remove gloves and wash your hands immediately after handling these items.  Do not share dishes, glasses, or other household items with the patient Avoid sharing household items. You should not share dishes, drinking glasses, cups, eating utensils, towels, bedding, or other items with a patient who is confirmed to have, or being evaluated for, COVID-19 infection. After the person uses these items, you should wash them thoroughly with soap and water.  Wash laundry thoroughly Immediately remove and wash clothes or bedding that have blood, body fluids, and/or secretions or excretions, such as sweat, saliva, sputum, nasal mucus, vomit, urine, or feces, on them. Wear gloves when handling laundry from the patient. Read and follow directions on labels of laundry or clothing items and detergent. In general, wash and dry with the warmest temperatures recommended on the label.  Clean all areas the individual has used often Clean all touchable surfaces, such as counters, tabletops, doorknobs, bathroom fixtures, toilets, phones, keyboards, tablets, and bedside tables, every day. Also, clean any surfaces that may have blood, body fluids, and/or secretions or excretions on them. Wear gloves when cleaning surfaces the patient has come in contact with. Use a diluted bleach solution (e.g., dilute bleach with 1 part bleach and 10 parts water) or a household disinfectant with a label that says EPA-registered for coronaviruses. To make a bleach solution at home, add 1 tablespoon of bleach to 1 quart (4 cups) of water. For a larger supply, add  cup of bleach to 1 gallon (16 cups) of water. Read labels of cleaning  products and follow recommendations provided on product labels. Labels contain instructions for safe and effective use of the cleaning product including precautions you should take when applying the product, such as wearing gloves or eye protection and making sure you have good ventilation during use of the product. Remove gloves and wash hands immediately after cleaning.  Monitor yourself for signs and symptoms of illness Caregivers and household members are considered close contacts, should monitor their health, and will be asked to limit movement outside of the home to the extent possible. Follow the monitoring steps for close contacts listed on the symptom monitoring form.   ? If you have additional questions, contact your local health department or call the epidemiologist on call at [phone] (available 24/7). ? This guidance is subject to change. For the most up-to-date guidance from [facility], please refer to their website: [email or web address]

## 2019-03-28 NOTE — Telephone Encounter (Signed)
Patient called and advised of the request for covid testing, she verbalized understanding. Appointment scheduled for tomorrow, 03/29/19 at [place] at [facility], advised of location and to wear a mask for everyone in the vehicle. Order placed.

## 2019-03-28 NOTE — ED Notes (Signed)
Pt refused medications, states she is driving and doesn't want meds that will make her drowsy.

## 2019-03-28 NOTE — ED Provider Notes (Signed)
[facility] EMERGENCY DEPARTMENT Provider Note   CSN: [id number] Arrival date & time: 03/28/19  1834     History   Chief Complaint Chief Complaint  Patient presents with  . Chills    HPI Carly Rogers is a 48 y.o. female with a history of gastric bypass, cholecystectomy, and tubal ligation who presents to the ED w/ complaints of body aches & chills x 1 week.  Patient reports she has had generalized body aches with chills as well as nasal congestion, headaches, nausea, mild dry cough, & mild intermittent dyspnea described as trouble breathing through her nose & getting air to her lungs.  States her symptoms are somewhat alleviated with Tylenol but not substantially.  No aggravating factors.  She describes her headache as being generalized, gradual onset, steady progression, and a 4 out of 10 in severity.  She states that she does not typically get headaches though.  Denies fever, but has been taking Tylenol fairly frequently.  Last took Tylenol at 4 PM today.  Denies change in vision, dizziness, numbness, weakness, syncope, or head injury.  She denies chest pain, abdominal pain, vomiting, diarrhea, or dysuria.  She supposed to see her PCP tomorrow for COVID testing, felt she could not wait.  She does not have direct known COVID-19 exposure, she states her sister was in contact with someone who was positive at work, however her sister tested negative.    HPI  History reviewed. No pertinent past medical history.  There are no active problems to display for this patient.   Past Surgical History:  Procedure Laterality Date  . CHOLECYSTECTOMY    . GASTRIC BYPASS    . TUBAL LIGATION       OB History   No obstetric history on file.      Home Medications    Prior to Admission medications   Medication Sig Start Date End Date Taking? Authorizing Provider  albuterol (VENTOLIN HFA) 108 (90 Base) MCG/ACT inhaler Inhale 2 puffs into the lungs every 6 (six) hours as needed for  wheezing or shortness of breath. 03/28/19   Sharion Balloon, FNP  benzonatate (TESSALON PERLES) 100 MG capsule Take 1 capsule (100 mg total) by mouth 3 (three) times daily as needed. 03/28/19   Sharion Balloon, FNP  esomeprazole (NEXIUM) 20 MG capsule Take 20 mg by mouth daily as needed (heart burn).    [provider]  gabapentin (NEURONTIN) 100 MG capsule Take 1 capsule (100 mg total) by mouth 2 (two) times daily. Patient not taking: Reported on 02/22/2018 04/19/17   Pieter Partridge, DO  Multiple Vitamin (MULTIVITAMIN WITH MINERALS) TABS tablet Take 1 tablet by mouth daily.    [provider]    Family History Family History  Problem Relation Age of Onset  . Hypertension Mother   . Heart attack Father   . Stroke Maternal Grandmother     Social History Social History   Tobacco Use  . Smoking status: Never Smoker  . Smokeless tobacco: Never Used  Substance Use Topics  . Alcohol use: Yes    Comment: occ  . Drug use: Not on file     Allergies   Patient has no known allergies.   Review of Systems Review of Systems  Constitutional: Positive for chills. Negative for fever.  HENT: Positive for congestion. Negative for ear pain and sore throat.   Eyes: Negative for visual disturbance.  Respiratory: Positive for cough and shortness of breath.   Cardiovascular: Negative  for chest pain and leg swelling.  Gastrointestinal: Positive for nausea. Negative for abdominal pain, blood in stool, constipation, diarrhea and vomiting.  Genitourinary: Negative for dysuria.  Neurological: Positive for headaches. Negative for dizziness, seizures, syncope, speech difficulty, weakness, light-headedness and numbness.  All other systems reviewed and are negative.    Physical Exam Updated Vital Signs BP 128/65 (BP Location: Left Arm)   Pulse 60   Temp 98.1 F (36.7 C) (Oral)   Resp 20   Ht 5\' 3"  (1.6 m)   Wt 81.6 kg   LMP 02/23/2019 (Approximate)   SpO2 100%   BMI 31.89  kg/m   Physical Exam Vitals signs and nursing note reviewed.  Constitutional:      General: She is not in acute distress.    Appearance: She is well-developed.  HENT:     Head: Normocephalic and atraumatic.     Right Ear: Ear canal normal. Tympanic membrane is not perforated, erythematous, retracted or bulging.     Left Ear: Ear canal normal. Tympanic membrane is not perforated, erythematous, retracted or bulging.     Ears:     Comments: No mastoid erythema/swelling/tenderness.     Nose: Congestion present.     Right Sinus: No maxillary sinus tenderness or frontal sinus tenderness.     Left Sinus: No maxillary sinus tenderness or frontal sinus tenderness.     Mouth/Throat:     Pharynx: Uvula midline. No oropharyngeal exudate or posterior oropharyngeal erythema.     Comments: Posterior oropharynx is symmetric appearing. Patient tolerating own secretions without difficulty. No trismus. No drooling. No hot potato voice. No swelling beneath the tongue, submandibular compartment is soft.  Eyes:     General:        Right eye: No discharge.        Left eye: No discharge.     Conjunctiva/sclera: Conjunctivae normal.     Pupils: Pupils are equal, round, and reactive to light.     Comments: No proptosis.   Neck:     Musculoskeletal: Normal range of motion and neck supple. No edema or neck rigidity.  Cardiovascular:     Rate and Rhythm: Normal rate and regular rhythm.     Heart sounds: No murmur.  Pulmonary:     Effort: Pulmonary effort is normal. No respiratory distress.     Breath sounds: Normal breath sounds. No wheezing, rhonchi or rales.  Abdominal:     General: There is no distension.     Palpations: Abdomen is soft.     Tenderness: There is no abdominal tenderness. There is no guarding or rebound.  Lymphadenopathy:     Cervical: No cervical adenopathy.  Skin:    General: Skin is warm and dry.     Findings: No rash.  Neurological:     Mental Status: She is alert.      Comments: Alert.  Clear speech.  CN III through XII grossly intact.  Sensation grossly intact bilateral upper and lower extremities.  5 out of 5 symmetric grip strength.  5 out of 5 strength with plantar dorsiflexion bilaterally.  Normal finger-to-nose.  Negative pronator drift.  Negative Romberg.  Patient is ambulatory.  Psychiatric:        Behavior: Behavior normal.    ED Treatments / Results  Labs (all labs ordered are listed, but only abnormal results are displayed) Labs Reviewed  URINALYSIS, ROUTINE W REFLEX MICROSCOPIC - Abnormal; Notable for the following components:      Result Value   Hgb  urine dipstick MODERATE (*)    All other components within normal limits  URINALYSIS, MICROSCOPIC (REFLEX) - Abnormal; Notable for the following components:   Bacteria, UA RARE (*)    All other components within normal limits  NOVEL CORONAVIRUS, NAA (HOSPITAL ORDER, SEND-OUT TO REF LAB)  PREGNANCY, URINE    EKG   Radiology Ct Head Wo Contrast  Result Date: 03/28/2019 CLINICAL DATA:  Headache EXAM: CT HEAD WITHOUT CONTRAST TECHNIQUE: Contiguous axial images were obtained from the base of the skull through the vertex without intravenous contrast. COMPARISON:  MRI 11/17/2017 FINDINGS: Brain: No acute territorial infarction, hemorrhage or intracranial mass. White matter disease is better appreciated on MRI. The ventricles are of normal size. Vascular: No hyperdense vessel or unexpected calcification. Skull: Normal. Negative for fracture or focal lesion. Sinuses/Orbits: No acute finding. Other: None IMPRESSION: Negative non contrasted CT appearance of the brain Electronically Signed   By: Donavan Foil M.D.   On: 03/28/2019 22:12   Dg Chest Port 1 View  Result Date: 03/28/2019 CLINICAL DATA:  Cough. Nausea and chills. EXAM: PORTABLE CHEST 1 VIEW COMPARISON:  None. FINDINGS: The cardiomediastinal contours are normal. The lungs are clear. Pulmonary vasculature is normal. No consolidation, pleural  effusion, or pneumothorax. No acute osseous abnormalities are seen. IMPRESSION: Negative AP view of the chest. Electronically Signed   By: Keith Rake M.D.   On: 03/28/2019 22:11    Procedures Procedures (including critical care time)  Medications Ordered in ED Medications  prochlorperazine (COMPAZINE) injection 10 mg (10 mg Intravenous Refused 03/28/19 2051)  diphenhydrAMINE (BENADRYL) injection 12.5 mg (12.5 mg Intravenous Refused 03/28/19 2051)  ondansetron (ZOFRAN) injection 4 mg (has no administration in time range)  acetaminophen (TYLENOL) tablet 650 mg (has no administration in time range)  sodium chloride 0.9 % bolus 1,000 mL (1,000 mLs Intravenous New Bag/Given 03/28/19 2041)     Initial Impression / Assessment and Plan / ED Course  I have reviewed the triage vital signs and the nursing notes.  Pertinent labs & imaging results that were available during my care of the patient were reviewed by me and considered in my medical decision making (see chart for details).   Patient presents to the emergency department with array of symptoms including body aches, chills, nausea, respiratory symptoms and headache.  She is nontoxic-appearing, in no apparent distress, vitals WNL.  On exam she has some nasal congestion, no sinus tenderness, afebrile, do not feel this is acute bacterial sinusitis requiring antibiotics.  TMs are clear.  Oropharynx is clear.  Lungs CTA- CXR negative for infiltrate doubt PNA. Xray also w/o effusion, edema, or PTX. NO wheezing. PERC negative doubt PE. Suspect dyspnea related to cough/congestion. No respiratory distress. She reports headache w/o hx of necessarily similar headache- CT head obtained w/o mass or obvious abnormality- gradual onset, steady progression, no visual disturbance, neuro deficits, fever, or nuchal rigidity- non concerning for [facility], ICH, ischemic CVA, dural venous sinus thrombosis, acute glaucoma, giant cell arteritis, mass, or meningitis. UA w/  hematuria- PCP recheck, no UTI. Preg test negative. Suspect viral syndrome, considering covid 19- [facility] test obtained, precautionary quarantine instructions. Will tx sxs w/ flonase, tessalon, zofran, continue tylenol, NSAIDs avoided secondary to prior gastric bypass. I discussed results, treatment plan, need for follow-up, and return precautions with the patient. Provided opportunity for questions, patient confirmed understanding and is in agreement with plan.   Final Clinical Impressions(s) / ED Diagnoses   Final diagnoses:  Viral illness    ED Discharge  Orders         Ordered    fluticasone (FLONASE) 50 MCG/ACT nasal spray  Daily     03/28/19 2222    benzonatate (TESSALON) 100 MG capsule  Every 8 hours     03/28/19 2222    ondansetron (ZOFRAN ODT) 4 MG disintegrating tablet  Every 8 hours PRN     03/28/19 [address], PA-C 03/28/19 2225    Blanchie Dessert, MD 03/28/19 2243

## 2019-03-28 NOTE — ED Notes (Signed)
Provided [place] department [facility] health [place] paperwork, pt instructions on quarantine procedures and signed paper will to self isolate.

## 2019-03-28 NOTE — Progress Notes (Signed)
E-Visit for Corona Virus Screening   Your current symptoms could be consistent with the coronavirus.  Call your health care provider or local health department to request and arrange formal testing. Many health care providers can now test patients at their office but not all are.  Please quarantine yourself while awaiting your test results.  [place] [phone], [place], [place] [phone] or visit [email or web address]  and You have been enrolled in [facility] for COVID-19.  Daily you will receive a questionnaire within the [facility] website. Our COVID-19 response team will be monitoring your responses daily.  Approximately 5 minutes was spent documenting and reviewing patient's chart.    COVID-19 is a respiratory illness with symptoms that are similar to the flu. Symptoms are typically mild to moderate, but there have been cases of severe illness and death due to the virus. The following symptoms may appear 2-14 days after exposure: . Fever . Cough . Shortness of breath or difficulty breathing . Chills . Repeated shaking with chills . Muscle pain . Headache . Sore throat . New loss of taste or smell . Fatigue . Congestion or runny nose . Nausea or vomiting . Diarrhea  It is vitally important that if you feel that you have an infection such as this virus or any other virus that you stay home and away from places where you may spread it to others.  You should self-quarantine for 14 days if you have symptoms that could potentially be coronavirus or have been in close contact a with a person diagnosed with COVID-19 within the last 2 weeks. You should avoid contact with people age 53 and older.   You should wear a mask or cloth face covering over your nose and mouth if you must be around other people or animals, including pets (even at  home). Try to stay at least 6 feet away from other people. This will protect the people around you.  You can use medication such as A prescription cough medication called Tessalon Perles 100 mg. You may take 1-2 capsules every 8 hours as needed for cough and A prescription inhaler called Albuterol MDI 90 mcg /actuation 2 puffs every 4 hours as needed for shortness of breath, wheezing, cough  You may also take acetaminophen (Tylenol) as needed for fever.   Reduce your risk of any infection by using the same precautions used for avoiding the common cold or flu:  [place] Wash your hands often with soap and warm water for at least 20 seconds.  If soap and water are not readily available, use an alcohol-based hand sanitizer with at least 60% alcohol.  . If coughing or sneezing, cover your mouth and nose by coughing or sneezing into the elbow areas of your shirt or coat, into a tissue or into your sleeve (not your hands). . Avoid shaking hands with others and consider head nods or verbal greetings only. . Avoid touching your eyes, nose, or mouth with unwashed hands.  . Avoid close contact with people who are sick. . Avoid places or events with large numbers of people in one location, like concerts or sporting events. . Carefully consider travel plans you have or are making. . If you are planning any travel outside or inside the [place], visit the CDC's Travelers' Health webpage for the latest health notices. . If you have some symptoms but not all symptoms, continue to monitor at home and seek medical attention if your symptoms  worsen. . If you are having a medical emergency, call 911.  HOME CARE . Only take medications as instructed by your medical team. . Drink plenty of fluids and get plenty of rest. . A steam or ultrasonic humidifier can help if you have congestion.   GET HELP RIGHT AWAY IF YOU HAVE EMERGENCY WARNING SIGNS** FOR COVID-19. If you or someone is showing any of these signs seek emergency  medical care immediately. Call 911 or proceed to your closest emergency facility if: . You develop worsening high fever. . Trouble breathing . Bluish lips or face . Persistent pain or pressure in the chest . New confusion . Inability to wake or stay awake . You cough up blood. . Your symptoms become more severe  **This list is not all possible symptoms. Contact your medical provider for any symptoms that are sever or concerning to you.   MAKE SURE YOU   Understand these instructions.  Will watch your condition.  Will get help right away if you are not doing well or get worse.  Your e-visit answers were reviewed by a board certified advanced clinical practitioner to complete your personal care plan.  Depending on the condition, your plan could have included both over the counter or prescription medications.  If there is a problem please reply once you have received a response from your provider.  Your safety is important to [place].  If you have drug allergies check your prescription carefully.    You can use MyChart to ask questions about today's visit, request a non-urgent call back, or ask for a work or school excuse for 24 hours related to this e-Visit. If it has been greater than 24 hours you will need to follow up with your provider, or enter a new e-Visit to address those concerns. You will get an e-mail in the next two days asking about your experience.  I hope that your e-visit has been valuable and will speed your recovery. Thank you for using e-visits.

## 2019-03-29 ENCOUNTER — Other Ambulatory Visit: Payer: 59

## 2019-03-29 ENCOUNTER — Encounter (INDEPENDENT_AMBULATORY_CARE_PROVIDER_SITE_OTHER): Payer: Self-pay

## 2019-03-30 ENCOUNTER — Encounter (INDEPENDENT_AMBULATORY_CARE_PROVIDER_SITE_OTHER): Payer: Self-pay

## 2019-03-30 LAB — NOVEL CORONAVIRUS, NAA (HOSP ORDER, SEND-OUT TO REF LAB; TAT 18-24 HRS): SARS-CoV-2, NAA: NOT DETECTED

## 2019-04-03 ENCOUNTER — Other Ambulatory Visit: Payer: Self-pay

## 2019-04-03 ENCOUNTER — Encounter: Payer: 59 | Admitting: Physical Therapy

## 2019-04-03 ENCOUNTER — Ambulatory Visit: Payer: 59 | Admitting: Physical Therapy

## 2019-04-03 ENCOUNTER — Encounter: Payer: Self-pay | Admitting: Physical Therapy

## 2019-04-03 DIAGNOSIS — M25561 Pain in right knee: Secondary | ICD-10-CM

## 2019-04-03 DIAGNOSIS — M25661 Stiffness of right knee, not elsewhere classified: Secondary | ICD-10-CM | POA: Diagnosis not present

## 2019-04-03 DIAGNOSIS — R262 Difficulty in walking, not elsewhere classified: Secondary | ICD-10-CM

## 2019-04-03 NOTE — Therapy (Signed)
[place], [place], [address] Phone: [phone]   Fax:  [phone]  Physical Therapy Treatment  Patient Details  Name: Carly Rogers MRN: [id number] Date of Birth: 1971/07/10 Referring Provider (PT): Andee Poles   Encounter Date: 04/03/2019  PT End of Session - 04/03/19 1545    Visit Number  9    Date for PT Re-Evaluation  04/08/19    PT Start Time  1500    PT Stop Time  1545    PT Time Calculation (min)  45 min    Activity Tolerance  Patient tolerated treatment well    Behavior During Therapy  [facility] for tasks assessed/performed       History reviewed. No pertinent past medical history.  Past Surgical History:  Procedure Laterality Date  . CHOLECYSTECTOMY    . GASTRIC BYPASS    . TUBAL LIGATION      There were no vitals filed for this visit.  Subjective Assessment - 04/03/19 1508    Subjective  "Good"    Currently in Pain?  No/denies    Pain Score  0-No pain                       [facility] Adult PT Treatment/Exercise - 04/03/19 0001      Knee/Hip Exercises: Aerobic   Elliptical  I10 R7 x4 min     Recumbent Bike  x 5 minutes L2       Knee/Hip Exercises: Machines for Strengthening   Cybex Knee Extension  15lb 2x10, RLE 5lb 2x10     Cybex Knee Flexion  35lb 2x10; RLE 15lb 2x10      Cybex Leg Press  50lb  2x15, RLE 30lb 2x10       Knee/Hip Exercises: Standing   Heel Raises  Both;2 sets;15 reps;2 seconds    Forward Step Up  Right;2 sets;10 reps;Hand Hold: 0;Step Height: 8"               PT Short Term Goals - 02/13/19 1429      PT SHORT TERM GOAL #1   Title  independent with initial HEP    Status  Achieved        PT Long Term Goals - 04/03/19 1546      PT LONG TERM GOAL #1   Title  able to go up and down stairs step over step    Status  Partially Met      PT LONG TERM GOAL #2   Title  decrease pain 50% with activity    Status  Achieved      PT LONG  TERM GOAL #3   Title  walk without deviation    Status  Achieved      PT LONG TERM GOAL #4   Title  increase AROM to WNL's    Status  Achieved            Plan - 04/03/19 1545    Clinical Impression Statement  Pt continues to do well with good strength and ROM. No reports of increase pain. Progressing towards goals.    Examination-Activity Limitations  Stand;Stairs;Lift;Locomotion Level;Squat    Examination-Participation Restrictions  Cleaning;Yard Work    [employer] officer  Evolving/Moderate complexity    Rehab Potential  Good    PT Frequency  2x / week    PT Duration  6 weeks    PT Treatment/Interventions  ADLs/Self Care Home Management;Electrical Stimulation;Cryotherapy;Gait  training;Stair training;Functional mobility training;Therapeutic activities;Neuromuscular re-education;Balance training;Therapeutic exercise;Patient/family education;Manual techniques;Vasopneumatic Device    PT Next Visit Plan  D/C PT next visit       Patient will benefit from skilled therapeutic intervention in order to improve the following deficits and impairments:  Abnormal gait, Decreased balance, Decreased endurance, Decreased mobility, Difficulty walking, Cardiopulmonary status limiting activity, Decreased range of motion, Decreased activity tolerance, Decreased strength, Impaired flexibility, Pain  Visit Diagnosis: 1. Stiffness of right knee, not elsewhere classified   2. Difficulty in walking, not elsewhere classified   3. Acute pain of right knee        Problem List There are no active problems to display for this patient.   Scot Jun, PTA 04/03/2019, 3:47 PM  [place], [place], [address] Phone: [phone]   Fax:  [phone]  Name: KIANNI LHEUREUX MRN: [id number] Date of Birth: 26-Feb-1971

## 2019-04-04 ENCOUNTER — Encounter (INDEPENDENT_AMBULATORY_CARE_PROVIDER_SITE_OTHER): Payer: Self-pay

## 2019-04-09 ENCOUNTER — Other Ambulatory Visit: Payer: Self-pay

## 2019-04-09 ENCOUNTER — Ambulatory Visit: Payer: 59 | Admitting: Physical Therapy

## 2019-04-09 ENCOUNTER — Encounter: Payer: Self-pay | Admitting: Physical Therapy

## 2019-04-09 DIAGNOSIS — R262 Difficulty in walking, not elsewhere classified: Secondary | ICD-10-CM

## 2019-04-09 DIAGNOSIS — M25561 Pain in right knee: Secondary | ICD-10-CM | POA: Diagnosis not present

## 2019-04-09 DIAGNOSIS — M25661 Stiffness of right knee, not elsewhere classified: Secondary | ICD-10-CM

## 2019-04-09 NOTE — Therapy (Signed)
[place], [place], [address] Phone: [phone]   Fax:  [phone]  Physical Therapy Treatment  Patient Details  Name: Carly Rogers MRN: [id number] Date of Birth: 05-27-1971 Referring Provider (PT): Andee Poles   Encounter Date: 04/09/2019  PT End of Session - 04/09/19 1046    Visit Number  10    Date for PT Re-Evaluation  04/08/19    PT Start Time  1000    PT Stop Time  1031    PT Time Calculation (min)  31 min    Activity Tolerance  Patient tolerated treatment well    Behavior During Therapy  [facility] for tasks assessed/performed       History reviewed. No pertinent past medical history.  Past Surgical History:  Procedure Laterality Date  . CHOLECYSTECTOMY    . GASTRIC BYPASS    . TUBAL LIGATION      There were no vitals filed for this visit.  Subjective Assessment - 04/09/19 1004    Subjective  "I am ok" Pt reports being active this weekend    Currently in Pain?  Yes    Pain Score  3     Pain Location  Knee    Pain Orientation  Right                       [facility] Adult PT Treatment/Exercise - 04/09/19 0001      Ambulation/Gait   Stairs  Yes    Stairs Assistance  7: Independent    Stair Management Technique  No rails;Alternating pattern    Number of Stairs  24    Height of Stairs  6    Gait Comments  Some RLE eccentric load weakness      Knee/Hip Exercises: Aerobic   Elliptical  I7 R5 x6 min     Recumbent Bike  x 4 minutes L3       Knee/Hip Exercises: Machines for Strengthening   Cybex Knee Extension  15lb 2x10, RLE 5lb 2x10     Cybex Knee Flexion  35lb 2x10; RLE 15lb 2x10      Cybex Leg Press  50lb  2x15, RLE 30lb 2x10       Knee/Hip Exercises: Standing   Heel Raises  Both;2 sets;15 reps;2 seconds               PT Short Term Goals - 02/13/19 1429      PT SHORT TERM GOAL #1   Title  independent with initial HEP    Status  Achieved        PT  Long Term Goals - 04/09/19 1006      PT LONG TERM GOAL #1   Title  able to go up and down stairs step over step    Status  Achieved      PT LONG TERM GOAL #2   Title  decrease pain 50% with activity    Status  Achieved      PT LONG TERM GOAL #3   Title  walk without deviation    Status  Achieved      PT LONG TERM GOAL #4   Title  increase AROM to WNL's    Status  Achieved            Plan - 04/09/19 1046    Clinical Impression Statement  All goal met, Some eccentric load weakness with RLE descending stairs. No issues with today's  interventions    Personal Factors and Comorbidities  Fitness    Examination-Activity Limitations  Stand;Stairs;Lift;Locomotion Level;Squat    Examination-Participation Restrictions  Cleaning;Yard Work    [employer] officer  Evolving/Moderate complexity    Rehab Potential  Good    PT Frequency  2x / week    PT Duration  6 weeks    PT Treatment/Interventions  ADLs/Self Care Home Management;Electrical Stimulation;Cryotherapy;Gait training;Stair training;Functional mobility training;Therapeutic activities;Neuromuscular re-education;Balance training;Therapeutic exercise;Patient/family education;Manual techniques;Vasopneumatic Device    PT Next Visit Plan  D/C       Patient will benefit from skilled therapeutic intervention in order to improve the following deficits and impairments:  Abnormal gait, Decreased balance, Decreased endurance, Decreased mobility, Difficulty walking, Cardiopulmonary status limiting activity, Decreased range of motion, Decreased activity tolerance, Decreased strength, Impaired flexibility, Pain  Visit Diagnosis: 1. Stiffness of right knee, not elsewhere classified   2. Acute pain of right knee   3. Difficulty in walking, not elsewhere classified        Problem List There are no active problems to display for this patient.  PHYSICAL THERAPY DISCHARGE SUMMARY  Visits from Start of Care:  10 Plan: Patient agrees to discharge.  Patient goals were met. Patient is being discharged due to meeting the stated rehab goals.  ?????       Scot Jun 04/09/2019, 10:47 AM  [place], [place], [address] Phone: [phone]   Fax:  [phone]  Name: SHENETTA SCHNACKENBERG MRN: [id number] Date of Birth: 04-Nov-1970

## 2019-04-10 ENCOUNTER — Encounter: Payer: 59 | Admitting: Physical Therapy

## 2019-04-23 ENCOUNTER — Other Ambulatory Visit: Payer: Self-pay | Admitting: Family

## 2019-04-23 DIAGNOSIS — Z20822 Contact with and (suspected) exposure to covid-19: Secondary | ICD-10-CM

## 2019-07-12 DIAGNOSIS — Z1231 Encounter for screening mammogram for malignant neoplasm of breast: Secondary | ICD-10-CM | POA: Diagnosis not present

## 2019-08-03 DIAGNOSIS — M8580 Other specified disorders of bone density and structure, unspecified site: Secondary | ICD-10-CM | POA: Diagnosis not present

## 2019-08-03 DIAGNOSIS — Z1321 Encounter for screening for nutritional disorder: Secondary | ICD-10-CM | POA: Diagnosis not present

## 2019-08-03 DIAGNOSIS — Z13220 Encounter for screening for lipoid disorders: Secondary | ICD-10-CM | POA: Diagnosis not present

## 2019-08-03 DIAGNOSIS — N951 Menopausal and female climacteric states: Secondary | ICD-10-CM | POA: Diagnosis not present

## 2019-08-03 DIAGNOSIS — Z01419 Encounter for gynecological examination (general) (routine) without abnormal findings: Secondary | ICD-10-CM | POA: Diagnosis not present

## 2019-08-03 DIAGNOSIS — Z1211 Encounter for screening for malignant neoplasm of colon: Secondary | ICD-10-CM | POA: Diagnosis not present

## 2019-08-03 DIAGNOSIS — Z131 Encounter for screening for diabetes mellitus: Secondary | ICD-10-CM | POA: Diagnosis not present

## 2019-08-03 DIAGNOSIS — Z130 Encounter for screening for diseases of the blood and blood-forming organs and certain disorders involving the immune mechanism: Secondary | ICD-10-CM | POA: Diagnosis not present

## 2019-08-03 DIAGNOSIS — Z1329 Encounter for screening for other suspected endocrine disorder: Secondary | ICD-10-CM | POA: Diagnosis not present

## 2019-09-11 DIAGNOSIS — Z1211 Encounter for screening for malignant neoplasm of colon: Secondary | ICD-10-CM | POA: Diagnosis not present

## 2019-09-11 DIAGNOSIS — K648 Other hemorrhoids: Secondary | ICD-10-CM | POA: Diagnosis not present

## 2019-09-26 DIAGNOSIS — M958 Other specified acquired deformities of musculoskeletal system: Secondary | ICD-10-CM | POA: Diagnosis not present

## 2019-09-26 DIAGNOSIS — Z4789 Encounter for other orthopedic aftercare: Secondary | ICD-10-CM | POA: Diagnosis not present

## 2019-10-02 DIAGNOSIS — M1711 Unilateral primary osteoarthritis, right knee: Secondary | ICD-10-CM | POA: Diagnosis not present

## 2020-01-13 DIAGNOSIS — S61412A Laceration without foreign body of left hand, initial encounter: Secondary | ICD-10-CM | POA: Diagnosis not present

## 2020-01-15 DIAGNOSIS — S66821A Laceration of other specified muscles, fascia and tendons at wrist and hand level, right hand, initial encounter: Secondary | ICD-10-CM | POA: Diagnosis not present

## 2020-01-15 DIAGNOSIS — M79641 Pain in right hand: Secondary | ICD-10-CM | POA: Diagnosis not present

## 2020-01-15 DIAGNOSIS — S61401A Unspecified open wound of right hand, initial encounter: Secondary | ICD-10-CM | POA: Diagnosis not present

## 2020-01-18 DIAGNOSIS — S61401A Unspecified open wound of right hand, initial encounter: Secondary | ICD-10-CM | POA: Diagnosis not present

## 2020-01-18 DIAGNOSIS — M79641 Pain in right hand: Secondary | ICD-10-CM | POA: Diagnosis not present

## 2020-01-18 DIAGNOSIS — S66821A Laceration of other specified muscles, fascia and tendons at wrist and hand level, right hand, initial encounter: Secondary | ICD-10-CM | POA: Diagnosis not present

## 2020-09-18 DIAGNOSIS — Z20822 Contact with and (suspected) exposure to covid-19: Secondary | ICD-10-CM | POA: Diagnosis not present

## 2020-10-04 IMAGING — MR MR KNEE*R* W/O CM
4 of 6 series · 19 of 40 positions shown · non-contrast
Comparison: None.

CLINICAL DATA: Chronic right knee pain.

EXAM:
MRI OF THE RIGHT KNEE WITHOUT CONTRAST
TECHNIQUE: Multiplanar, multisequence MR imaging of the knee was performed. No
intravenous contrast was administered.

[Series 3: T2 fat-sat · axial · 4.0mm · 0.31mm/px · z∈[-24,+46]mm · 3 of 25 slices shown (1 of 2)]
[im 5/25]
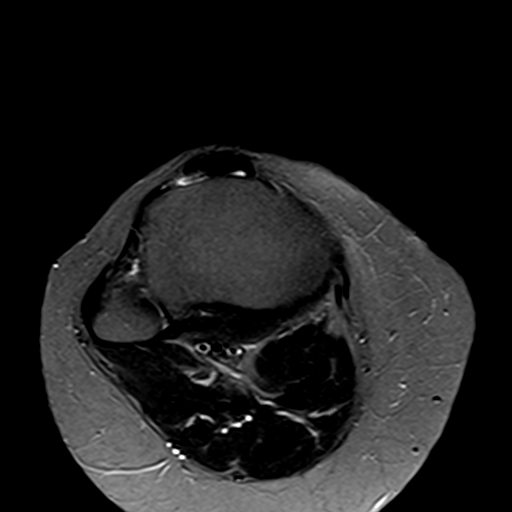
[im 13/25]
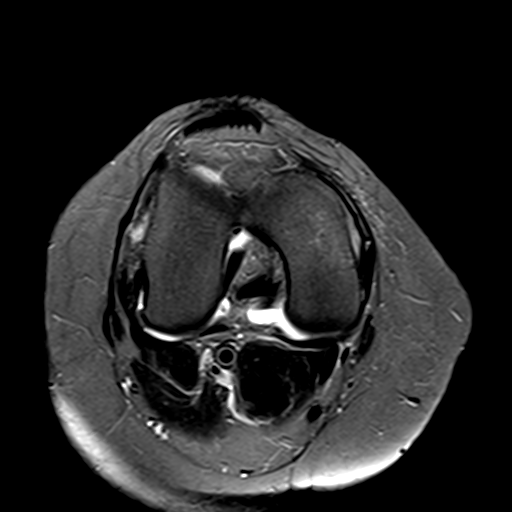
[im 21/25]
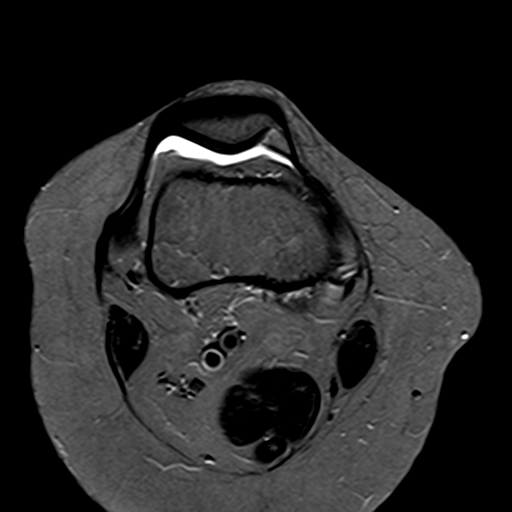

[Series 5: T2 fat-sat · coronal · 4.0mm · 0.29mm/px · 3 of 24 slices shown (2 of 2)]
[im 5/24]
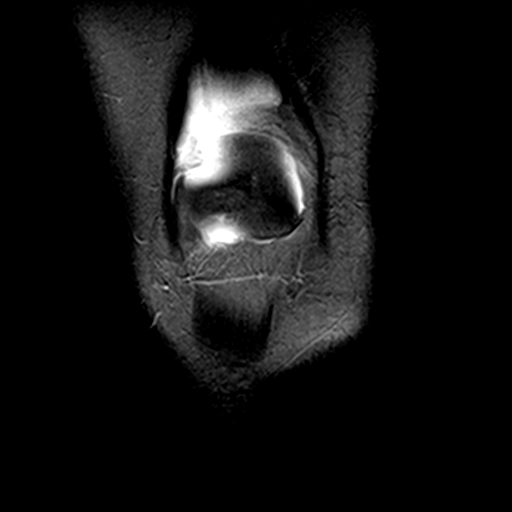
[im 14/24]
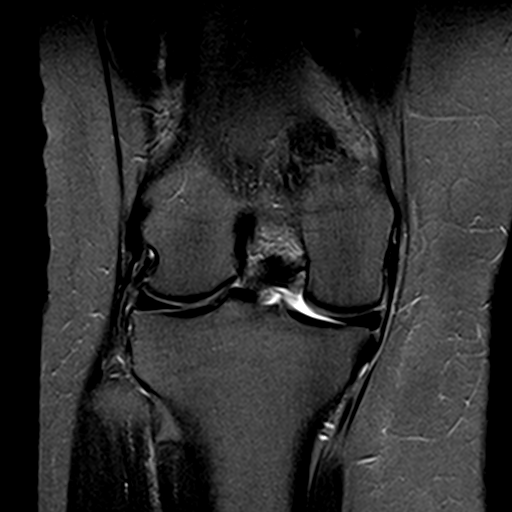
[im 24/24]
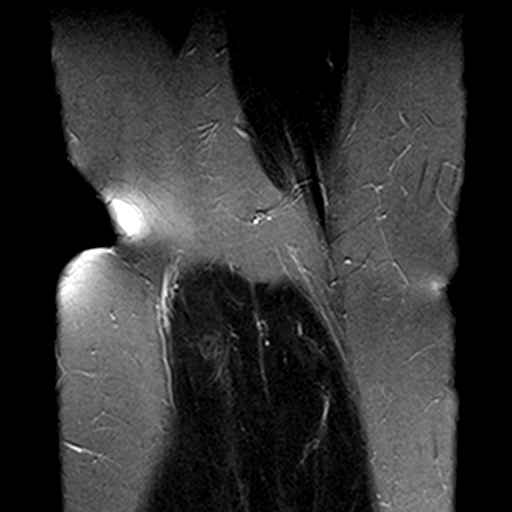

[Series 6: PD fat-sat · coronal · 3.0mm · 0.29mm/px · 7 of 28 slices shown (1 of 2)]
[im 1/28]
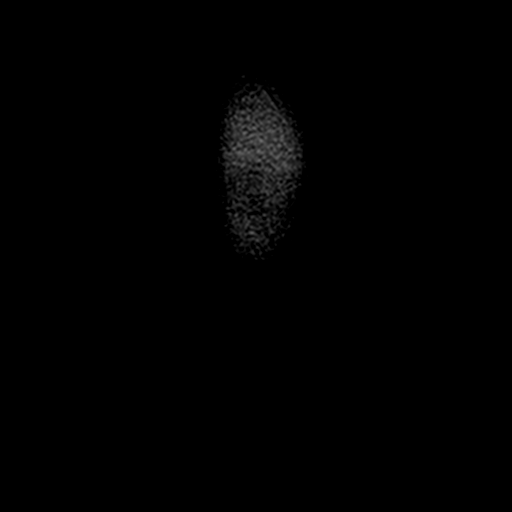
[im 5/28]
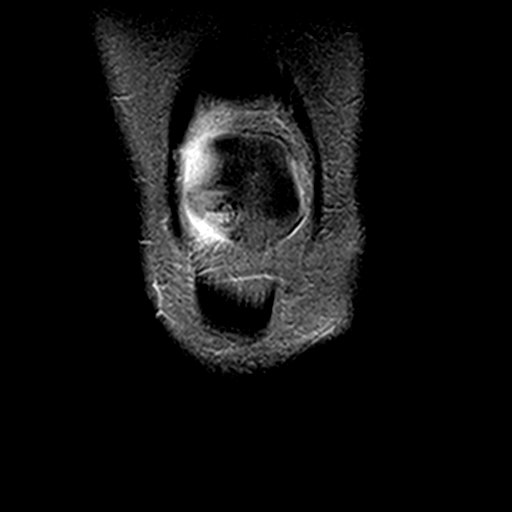
[im 10/28]
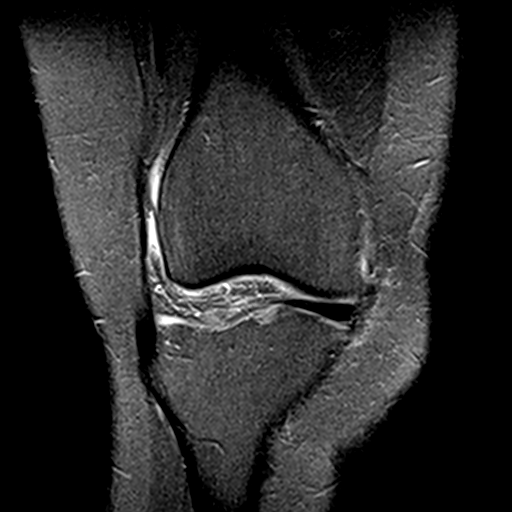
[im 14/28]
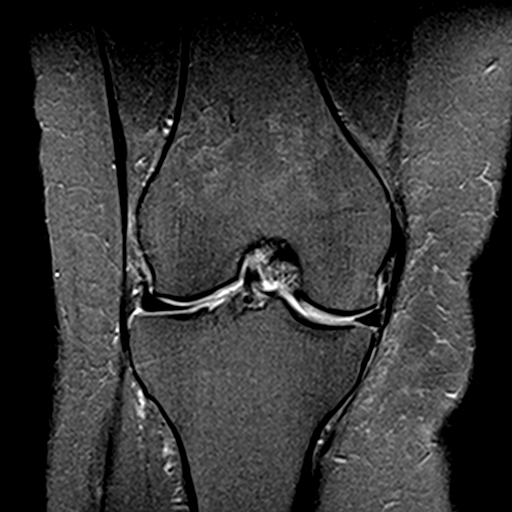
[im 19/28]
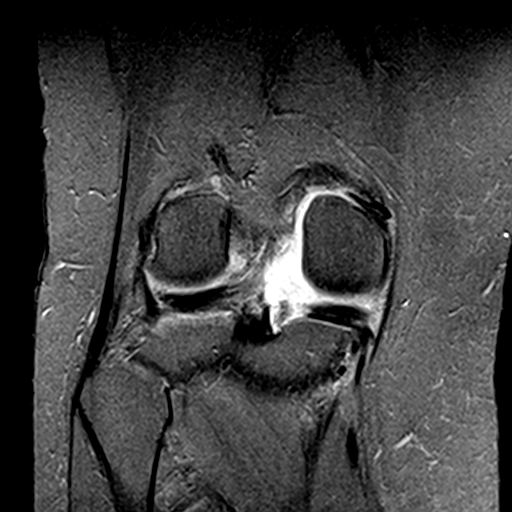
[im 23/28]
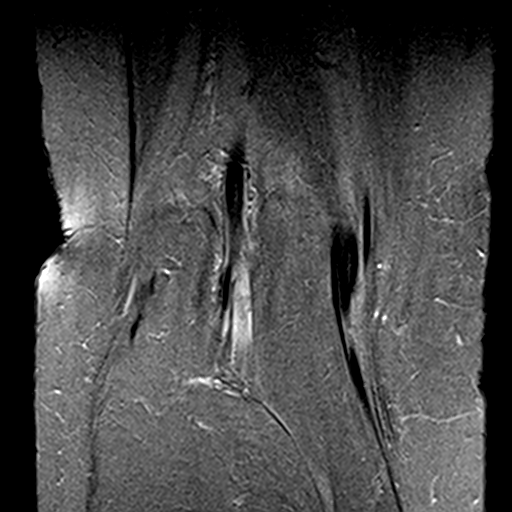
[im 28/28]
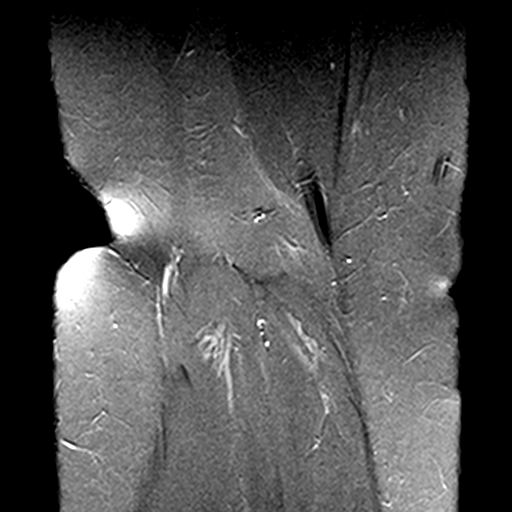

[Series 7: PD fat-sat · sagittal · 3.0mm · 0.29mm/px · 6 of 26 slices shown (2 of 2)]
[im 1/26]
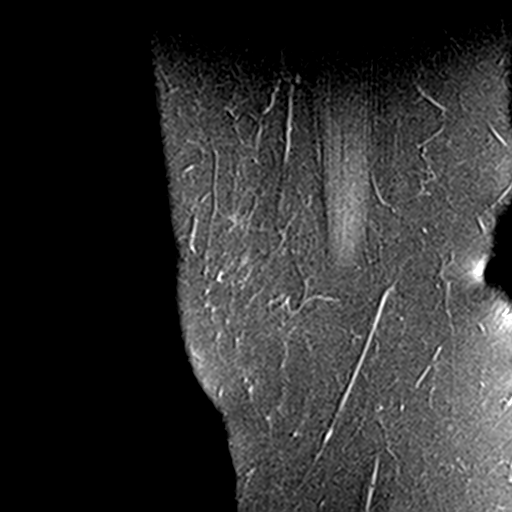
[im 5/26]
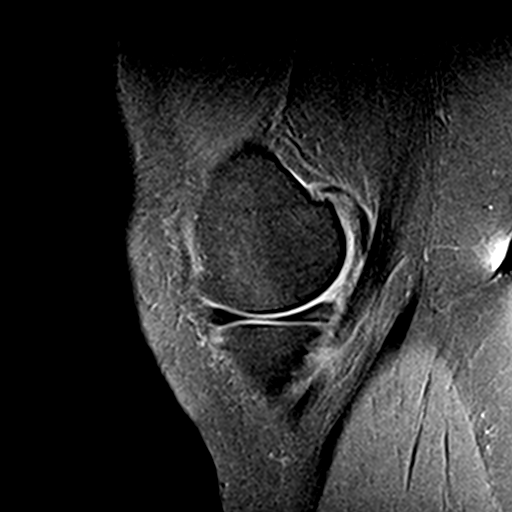
[im 9/26]
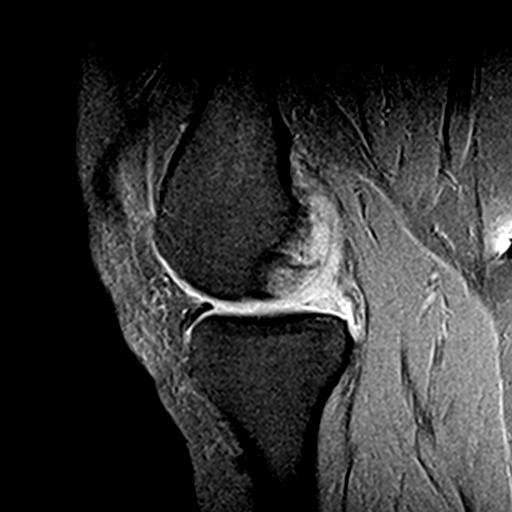
[im 13/26]
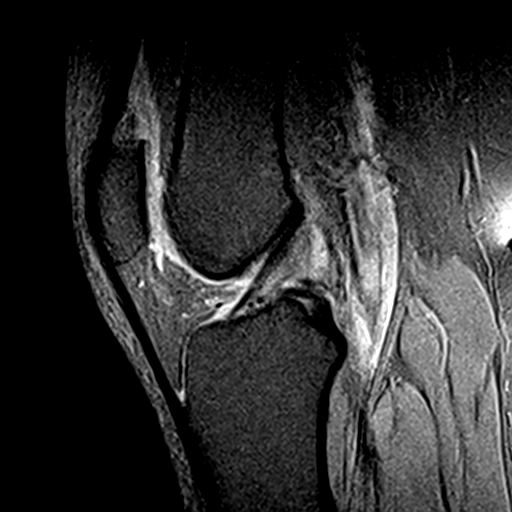
[im 17/26]
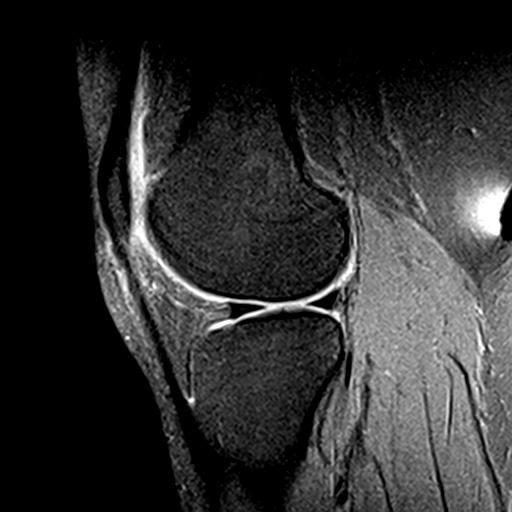
[im 21/26]
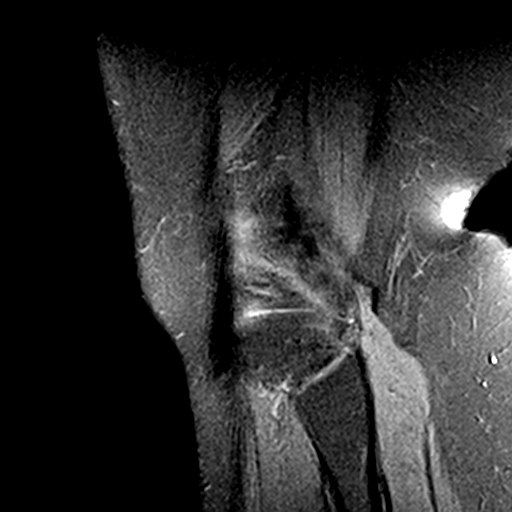

[19 of 40 positions shown; findings below may reference images not displayed]

FINDINGS: MENISCI

Medial meniscus: Full-thickness radial tear involving the posterior
horn back near the meniscal root with approximately 4 mm of
separation. Associated mild medial protrusion of the meniscus.

Lateral meniscus:  Intact

LIGAMENTS

Cruciates:  Intact

Collaterals:  Intact

CARTILAGE

Patellofemoral: Mild to moderate degenerative chondrosis mainly
along the lateral facet where there is also early joint space
narrowing and spurring.

Medial: Moderate degenerative chondrosis with mild subchondral
cystic change and early joint space narrowing.

Lateral:  Mild degenerative chondrosis.

Joint:  Small joint effusion.

Popliteal Fossa:  No popliteal mass or Baker's cyst.

Extensor Mechanism: The patella retinacular structures are intact
and the quadriceps and patellar tendons are intact.

Bones:  No acute bony findings.

Other: Normal knee musculature.
IMPRESSION: 1. Full-thickness radial tear involving the posterior horn of the
medial meniscus back near the meniscal root.
2. Intact ligamentous structures and no acute bony findings.
3. Tricompartmental degenerative changes as detailed above.
4. Small joint effusion.

## 2020-10-20 DIAGNOSIS — L739 Follicular disorder, unspecified: Secondary | ICD-10-CM | POA: Diagnosis not present

## 2020-10-20 DIAGNOSIS — L738 Other specified follicular disorders: Secondary | ICD-10-CM | POA: Diagnosis not present

## 2020-10-20 DIAGNOSIS — L720 Epidermal cyst: Secondary | ICD-10-CM | POA: Diagnosis not present

## 2020-10-20 DIAGNOSIS — D229 Melanocytic nevi, unspecified: Secondary | ICD-10-CM | POA: Diagnosis not present

## 2020-10-22 DIAGNOSIS — U071 COVID-19: Secondary | ICD-10-CM | POA: Diagnosis not present

## 2020-11-06 DIAGNOSIS — M1711 Unilateral primary osteoarthritis, right knee: Secondary | ICD-10-CM | POA: Diagnosis not present

## 2020-11-06 DIAGNOSIS — M1712 Unilateral primary osteoarthritis, left knee: Secondary | ICD-10-CM | POA: Diagnosis not present

## 2020-11-17 DIAGNOSIS — D489 Neoplasm of uncertain behavior, unspecified: Secondary | ICD-10-CM | POA: Diagnosis not present

## 2020-11-17 DIAGNOSIS — L738 Other specified follicular disorders: Secondary | ICD-10-CM | POA: Diagnosis not present

## 2020-11-17 DIAGNOSIS — L308 Other specified dermatitis: Secondary | ICD-10-CM | POA: Diagnosis not present

## 2020-11-20 DIAGNOSIS — N951 Menopausal and female climacteric states: Secondary | ICD-10-CM | POA: Diagnosis not present

## 2020-11-20 DIAGNOSIS — Z1231 Encounter for screening mammogram for malignant neoplasm of breast: Secondary | ICD-10-CM | POA: Diagnosis not present

## 2020-11-20 DIAGNOSIS — Z7189 Other specified counseling: Secondary | ICD-10-CM | POA: Diagnosis not present

## 2020-11-20 DIAGNOSIS — Z01419 Encounter for gynecological examination (general) (routine) without abnormal findings: Secondary | ICD-10-CM | POA: Diagnosis not present

## 2020-11-20 DIAGNOSIS — Z124 Encounter for screening for malignant neoplasm of cervix: Secondary | ICD-10-CM | POA: Diagnosis not present

## 2020-11-26 ENCOUNTER — Other Ambulatory Visit (HOSPITAL_COMMUNITY): Payer: Self-pay | Admitting: Obstetrics and Gynecology

## 2020-12-29 MED FILL — ESTRADIOL TDS 0.025 MG/DAY: 0.025 | 28 days supply | Qty: 4 | Fill #0
